# Patient Record
Sex: Male | Born: 1955 | Race: White | Hispanic: No | Marital: Married | Smoking: Never smoker
Health system: Southern US, Community
[De-identification: ages and names within clinical notes are randomized; demographics above are authoritative.]

## PROBLEM LIST (undated history)

## (undated) DIAGNOSIS — M1990 Unspecified osteoarthritis, unspecified site: Secondary | ICD-10-CM

## (undated) DIAGNOSIS — Z9889 Other specified postprocedural states: Secondary | ICD-10-CM

## (undated) DIAGNOSIS — R0600 Dyspnea, unspecified: Secondary | ICD-10-CM

## (undated) DIAGNOSIS — R112 Nausea with vomiting, unspecified: Secondary | ICD-10-CM

## (undated) DIAGNOSIS — R51 Headache: Secondary | ICD-10-CM

## (undated) DIAGNOSIS — T4145XA Adverse effect of unspecified anesthetic, initial encounter: Secondary | ICD-10-CM

## (undated) DIAGNOSIS — R519 Headache, unspecified: Secondary | ICD-10-CM

## (undated) HISTORY — PX: TONSILLECTOMY: SUR1361

---

## 1980-12-05 HISTORY — PX: VASECTOMY: SHX75

## 1999-12-06 DIAGNOSIS — T8859XA Other complications of anesthesia, initial encounter: Secondary | ICD-10-CM

## 1999-12-06 HISTORY — PX: ARTHROSCOPIC REPAIR ACL: SUR80

## 1999-12-06 HISTORY — DX: Other complications of anesthesia, initial encounter: T88.59XA

## 2004-10-08 ENCOUNTER — Ambulatory Visit: Payer: Self-pay | Admitting: Family Medicine

## 2004-12-01 ENCOUNTER — Ambulatory Visit: Payer: Self-pay | Admitting: Family Medicine

## 2016-10-10 NOTE — H&P (Signed)
TOTAL KNEE ADMISSION H&P  Patient is being admitted for left total knee arthroplasty.  Subjective:  Chief Complaint:left knee pain.  HPI: Larry Carr, 60 y.o. male, has a history of pain and functional disability in the left knee due to arthritis and has failed non-surgical conservative treatments for greater than 12 weeks to includeNSAID's and/or analgesics and corticosteriod injections.  Onset of symptoms was gradual, starting 1 years ago with rapidlly worsening course since that time. The patient noted no past surgery on the left knee(s).  Patient currently rates pain in the left knee(s) at 7 out of 10 with activity. Patient has night pain, worsening of pain with activity and weight bearing, pain that interferes with activities of daily living, crepitus and joint swelling.  Patient has evidence of subchondral sclerosis and joint space narrowing by imaging studies. There is no active infection.  There are no active problems to display for this patient.  No past medical history on file.  No past surgical history on file.  No prescriptions prior to admission.   Allergies not on file  Social History  Substance Use Topics  . Smoking status: Not on file  . Smokeless tobacco: Not on file  . Alcohol use Not on file    No family history on file.   ROS  Objective:  Physical Exam  Vital signs in last 24 hours:    Labs:   There is no height or weight on file to calculate BMI.   Imaging Review Plain radiographs demonstrate severe degenerative joint disease of the left knee(s). The overall alignment ismild varus. The bone quality appears to be fair for age and reported activity level.  Assessment/Plan:  End stage arthritis, left knee   The patient history, physical examination, clinical judgment of the provider and imaging studies are consistent with end stage degenerative joint disease of the left knee(s) and total knee arthroplasty is deemed medically necessary. The treatment  options including medical management, injection therapy arthroscopy and arthroplasty were discussed at length. The risks and benefits of total knee arthroplasty were presented and reviewed. The risks due to aseptic loosening, infection, stiffness, patella tracking problems, thromboembolic complications and other imponderables were discussed. The patient acknowledged the explanation, agreed to proceed with the plan and consent was signed. Patient is being admitted for inpatient treatment for surgery, pain control, PT, OT, prophylactic antibiotics, VTE prophylaxis, progressive ambulation and ADL's and discharge planning. The patient is planning to be discharged home with home health services

## 2016-10-11 NOTE — Pre-Procedure Instructions (Addendum)
Larry Carr  10/11/2016      [facility] [name] - [address] [place] [address] Phone: [phone] Fax: [phone]    Your procedure is scheduled on Wednesday  November 22.  [facility] 10:30 A.M.  Call this number if you have problems the morning of surgery:  [phone]   Remember:  Do not eat food or drink liquids after midnight.  Take these medicines the morning of surgery with A SIP OF WATER: acetaminophen (tylenol) if needed, flonase if needed  7 days prior to surgery(10/19/16) STOP taking any Aspirin, Aleve, Naproxen, Ibuprofen, Motrin, Advil, Goody's, BC's, all herbal medications, fish oil, and all vitamins    Do not wear jewelry, make-up or nail polish.  Do not wear lotions, powders, or perfumes, or deoderant.  Do not shave 48 hours prior to surgery.  Men may shave face and neck.  Do not bring valuables to the hospital.  [facility] is not responsible for any belongings or valuables.  Contacts, dentures or bridgework may not be worn into surgery.  Leave your suitcase in the car.  After surgery it may be brought to your room.  For patients admitted to the hospital, discharge time will be determined by your treatment team.  Patients discharged the day of surgery will not be allowed to drive home.    Special instructions:     [place]- Preparing For Surgery  Before surgery, you can play an important role. Because skin is not sterile, your skin needs to be as free of germs as possible. You can reduce the number of germs on your skin by washing with CHG (chlorahexidine gluconate) Soap before surgery.  CHG is an antiseptic cleaner which kills germs and bonds with the skin to continue killing germs even after washing.  Please do not use if you have an allergy to CHG or antibacterial soaps. If your skin becomes reddened/irritated stop using the CHG.  Do not shave  (including legs and underarms) for at least 48 hours prior to first CHG shower. It is OK to shave your face.  Please follow these instructions carefully.   1. Shower the NIGHT BEFORE SURGERY and the MORNING OF SURGERY with CHG.   2. If you chose to wash your hair, wash your hair first as usual with your normal shampoo.  3. After you shampoo, rinse your hair and body thoroughly to remove the shampoo.  4. Use CHG as you would any other liquid soap. You can apply CHG directly to the skin and wash gently with a scrungie or a clean washcloth.   5. Apply the CHG Soap to your body ONLY FROM THE NECK DOWN.  Do not use on open wounds or open sores. Avoid contact with your eyes, ears, mouth and genitals (private parts). Wash genitals (private parts) with your normal soap.  6. Wash thoroughly, paying special attention to the area where your surgery will be performed.  7. Thoroughly rinse your body with warm water from the neck down.  8. DO NOT shower/wash with your normal soap after using and rinsing off the CHG Soap.  9. Pat yourself dry with a CLEAN TOWEL.   10. Wear CLEAN PAJAMAS   11. Place CLEAN SHEETS on your bed the night of your first shower and DO NOT SLEEP WITH PETS.    Day of Surgery: Do not apply any deodorants/lotions. Please wear clean clothes  to the hospital/surgery center.      Please read over the  fact sheets that you were given.

## 2016-10-12 ENCOUNTER — Encounter (HOSPITAL_COMMUNITY)
Admission: RE | Admit: 2016-10-12 | Discharge: 2016-10-12 | Disposition: A | Discharge status: Home / Self Care | Payer: BLUE CROSS/BLUE SHIELD | Source: Ambulatory Visit | Attending: Orthopedic Surgery | Admitting: Orthopedic Surgery

## 2016-10-12 ENCOUNTER — Encounter (HOSPITAL_COMMUNITY): Payer: Self-pay

## 2016-10-12 DIAGNOSIS — M1712 Unilateral primary osteoarthritis, left knee: Secondary | ICD-10-CM | POA: Insufficient documentation

## 2016-10-12 DIAGNOSIS — Z01812 Encounter for preprocedural laboratory examination: Secondary | ICD-10-CM | POA: Diagnosis not present

## 2016-10-12 HISTORY — DX: Unspecified osteoarthritis, unspecified site: M19.90

## 2016-10-12 HISTORY — DX: Adverse effect of unspecified anesthetic, initial encounter: T41.45XA

## 2016-10-12 HISTORY — DX: Dyspnea, unspecified: R06.00

## 2016-10-12 LAB — CBC
HCT: 47.5 % (ref 39.0–52.0)
Hemoglobin: 16.4 g/dL (ref 13.0–17.0)
MCH: 29.8 pg (ref 26.0–34.0)
MCHC: 34.5 g/dL (ref 30.0–36.0)
MCV: 86.4 fL (ref 78.0–100.0)
PLATELETS: 276 10*3/uL (ref 150–400)
RBC: 5.5 MIL/uL (ref 4.22–5.81)
RDW: 12.4 % (ref 11.5–15.5)
WBC: 7.3 10*3/uL (ref 4.0–10.5)

## 2016-10-12 LAB — SURGICAL PCR SCREEN
MRSA, PCR: NEGATIVE
Staphylococcus aureus: NEGATIVE

## 2016-10-12 LAB — TYPE AND SCREEN
ABO/RH(D): O NEG
ANTIBODY SCREEN: NEGATIVE

## 2016-10-12 LAB — ABO/RH: ABO/RH(D): O NEG

## 2016-10-25 MED ORDER — TRANEXAMIC ACID 1000 MG/10ML IV SOLN
1000.0000 mg | INTRAVENOUS | Status: AC
  Administered 2016-10-26: 1000 mg via INTRAVENOUS
  Filled 2016-10-25: qty 10

## 2016-10-26 ENCOUNTER — Encounter (HOSPITAL_COMMUNITY): Payer: Self-pay | Admitting: *Deleted

## 2016-10-26 ENCOUNTER — Inpatient Hospital Stay (HOSPITAL_COMMUNITY): Payer: BLUE CROSS/BLUE SHIELD | Admitting: Anesthesiology

## 2016-10-26 ENCOUNTER — Inpatient Hospital Stay (HOSPITAL_COMMUNITY): Payer: BLUE CROSS/BLUE SHIELD

## 2016-10-26 ENCOUNTER — Inpatient Hospital Stay (HOSPITAL_COMMUNITY)
Admission: RE | Admit: 2016-10-26 | Discharge: 2016-10-28 | DRG: 470 | Disposition: A | Discharge status: Home Health | Payer: BLUE CROSS/BLUE SHIELD | Source: Ambulatory Visit | Attending: Orthopedic Surgery | Admitting: Orthopedic Surgery

## 2016-10-26 ENCOUNTER — Encounter (HOSPITAL_COMMUNITY)
Admission: RE | Disposition: A | Discharge status: Home Health | Payer: Self-pay | Source: Ambulatory Visit | Attending: Orthopedic Surgery

## 2016-10-26 DIAGNOSIS — Z96652 Presence of left artificial knee joint: Secondary | ICD-10-CM

## 2016-10-26 DIAGNOSIS — D62 Acute posthemorrhagic anemia: Secondary | ICD-10-CM | POA: Diagnosis not present

## 2016-10-26 DIAGNOSIS — M25562 Pain in left knee: Secondary | ICD-10-CM | POA: Diagnosis present

## 2016-10-26 DIAGNOSIS — M1712 Unilateral primary osteoarthritis, left knee: Secondary | ICD-10-CM | POA: Diagnosis present

## 2016-10-26 HISTORY — PX: TOTAL KNEE ARTHROPLASTY: SHX125

## 2016-10-26 SURGERY — ARTHROPLASTY, KNEE, TOTAL
Anesthesia: Regional | Site: Knee | Laterality: Left

## 2016-10-26 MED ORDER — CHLORHEXIDINE GLUCONATE 4 % EX LIQD: 60.0000 mL | Freq: Once | CUTANEOUS | Status: DC

## 2016-10-26 MED ORDER — HYDROMORPHONE HCL 2 MG/ML IJ SOLN
0.5000 mg | INTRAMUSCULAR | Status: DC | PRN
Start: 2016-10-26 — End: 2016-10-28
  Administered 2016-10-26: 0.5 mg via INTRAVENOUS
  Administered 2016-10-26: 1 mg via INTRAVENOUS
  Filled 2016-10-26 (×3): qty 1

## 2016-10-26 MED ORDER — LIDOCAINE 2% (20 MG/ML) 5 ML SYRINGE
INTRAMUSCULAR | Status: AC
  Filled 2016-10-26: qty 5

## 2016-10-26 MED ORDER — BISACODYL 10 MG RE SUPP: 10.0000 mg | Freq: Every day | RECTAL | Status: DC | PRN

## 2016-10-26 MED ORDER — MIDAZOLAM HCL 2 MG/2ML IJ SOLN
INTRAMUSCULAR | Status: AC
  Filled 2016-10-26: qty 2

## 2016-10-26 MED ORDER — FENTANYL CITRATE (PF) 100 MCG/2ML IJ SOLN
INTRAMUSCULAR | Status: AC
  Filled 2016-10-26: qty 4

## 2016-10-26 MED ORDER — ALUM & MAG HYDROXIDE-SIMETH 200-200-20 MG/5ML PO SUSP: 30.0000 mL | ORAL | Status: DC | PRN

## 2016-10-26 MED ORDER — ONDANSETRON HCL 4 MG/2ML IJ SOLN
INTRAMUSCULAR | Status: AC
  Filled 2016-10-26: qty 2

## 2016-10-26 MED ORDER — BUPIVACAINE HCL 0.5 % IJ SOLN
INTRAMUSCULAR | Status: DC | PRN
  Administered 2016-10-26: 10 mL

## 2016-10-26 MED ORDER — PHENOL 1.4 % MT LIQD: 1.0000 | OROMUCOSAL | Status: DC | PRN

## 2016-10-26 MED ORDER — METOCLOPRAMIDE HCL 5 MG PO TABS
5.0000 mg | ORAL_TABLET | Freq: Three times a day (TID) | ORAL | Status: DC | PRN
Start: 2016-10-26 — End: 2016-10-28

## 2016-10-26 MED ORDER — BUPIVACAINE LIPOSOME 1.3 % IJ SUSP
INTRAMUSCULAR | Status: DC | PRN
  Administered 2016-10-26: 20 mL

## 2016-10-26 MED ORDER — BUPIVACAINE LIPOSOME 1.3 % IJ SUSP
20.0000 mL | Freq: Once | INTRAMUSCULAR | Status: DC
  Filled 2016-10-26: qty 20

## 2016-10-26 MED ORDER — METHOCARBAMOL 1000 MG/10ML IJ SOLN
500.0000 mg | Freq: Four times a day (QID) | INTRAVENOUS | Status: DC | PRN
  Filled 2016-10-26: qty 5

## 2016-10-26 MED ORDER — ZOLPIDEM TARTRATE 5 MG PO TABS: 5.0000 mg | ORAL_TABLET | Freq: Every evening | ORAL | Status: DC | PRN

## 2016-10-26 MED ORDER — SODIUM CHLORIDE 0.9 % IR SOLN
Status: DC | PRN
  Administered 2016-10-26: 1

## 2016-10-26 MED ORDER — POTASSIUM CHLORIDE IN NACL 20-0.9 MEQ/L-% IV SOLN
INTRAVENOUS | Status: DC
  Administered 2016-10-26: 20:00:00 via INTRAVENOUS
  Filled 2016-10-26: qty 1000

## 2016-10-26 MED ORDER — CEFAZOLIN SODIUM-DEXTROSE 2-4 GM/100ML-% IV SOLN
2.0000 g | Freq: Four times a day (QID) | INTRAVENOUS | Status: AC
  Administered 2016-10-26 – 2016-10-27 (×2): 2 g via INTRAVENOUS
  Filled 2016-10-26 (×2): qty 100

## 2016-10-26 MED ORDER — CELECOXIB 200 MG PO CAPS
200.0000 mg | ORAL_CAPSULE | Freq: Two times a day (BID) | ORAL | Status: DC
  Administered 2016-10-26 – 2016-10-28 (×4): 200 mg via ORAL
  Filled 2016-10-26 (×4): qty 1

## 2016-10-26 MED ORDER — DEXAMETHASONE SODIUM PHOSPHATE 10 MG/ML IJ SOLN
10.0000 mg | Freq: Once | INTRAMUSCULAR | Status: AC
  Administered 2016-10-27: 10 mg via INTRAVENOUS
  Filled 2016-10-26: qty 1

## 2016-10-26 MED ORDER — OXYCODONE HCL 5 MG PO TABS: 5.0000 mg | ORAL_TABLET | Freq: Once | ORAL | Status: DC | PRN

## 2016-10-26 MED ORDER — ASPIRIN EC 325 MG PO TBEC: 325.0000 mg | DELAYED_RELEASE_TABLET | Freq: Every day | ORAL | 0 refills | Status: DC

## 2016-10-26 MED ORDER — LACTATED RINGERS IV SOLN
INTRAVENOUS | Status: DC
  Administered 2016-10-26 (×3): via INTRAVENOUS

## 2016-10-26 MED ORDER — ACETAMINOPHEN 325 MG PO TABS: 650.0000 mg | ORAL_TABLET | Freq: Four times a day (QID) | ORAL | Status: DC | PRN

## 2016-10-26 MED ORDER — MIDAZOLAM HCL 5 MG/5ML IJ SOLN
INTRAMUSCULAR | Status: DC | PRN
  Administered 2016-10-26 (×2): 2 mg via INTRAVENOUS

## 2016-10-26 MED ORDER — ONDANSETRON HCL 4 MG/2ML IJ SOLN
INTRAMUSCULAR | Status: DC | PRN
  Administered 2016-10-26: 4 mg via INTRAVENOUS

## 2016-10-26 MED ORDER — PROPOFOL 500 MG/50ML IV EMUL
INTRAVENOUS | Status: DC | PRN
  Administered 2016-10-26: 25 ug/kg/min via INTRAVENOUS

## 2016-10-26 MED ORDER — OXYCODONE HCL 5 MG/5ML PO SOLN: 5.0000 mg | Freq: Once | ORAL | Status: DC | PRN

## 2016-10-26 MED ORDER — METHOCARBAMOL 500 MG PO TABS: 500.0000 mg | ORAL_TABLET | Freq: Four times a day (QID) | ORAL | 0 refills | Status: DC | PRN

## 2016-10-26 MED ORDER — HYDROMORPHONE HCL 1 MG/ML IJ SOLN: 0.2500 mg | INTRAMUSCULAR | Status: DC | PRN

## 2016-10-26 MED ORDER — POLYETHYLENE GLYCOL 3350 17 G PO PACK: 17.0000 g | PACK | Freq: Every day | ORAL | Status: DC | PRN

## 2016-10-26 MED ORDER — DIPHENHYDRAMINE HCL 12.5 MG/5ML PO ELIX: 12.5000 mg | ORAL_SOLUTION | ORAL | Status: DC | PRN

## 2016-10-26 MED ORDER — LACTATED RINGERS IV SOLN
INTRAVENOUS | Status: DC
Start: 2016-10-26 — End: 2016-10-26

## 2016-10-26 MED ORDER — CEFAZOLIN SODIUM-DEXTROSE 2-4 GM/100ML-% IV SOLN
2.0000 g | INTRAVENOUS | Status: AC
  Administered 2016-10-26: 2 g via INTRAVENOUS
  Filled 2016-10-26: qty 100

## 2016-10-26 MED ORDER — ASPIRIN EC 325 MG PO TBEC
325.0000 mg | DELAYED_RELEASE_TABLET | Freq: Every day | ORAL | Status: DC
  Administered 2016-10-27 – 2016-10-28 (×2): 325 mg via ORAL
  Filled 2016-10-26 (×2): qty 1

## 2016-10-26 MED ORDER — LIDOCAINE 2% (20 MG/ML) 5 ML SYRINGE
INTRAMUSCULAR | Status: DC | PRN
  Administered 2016-10-26: 60 mg via INTRAVENOUS

## 2016-10-26 MED ORDER — METOCLOPRAMIDE HCL 5 MG/ML IJ SOLN: 5.0000 mg | Freq: Three times a day (TID) | INTRAMUSCULAR | Status: DC | PRN

## 2016-10-26 MED ORDER — ACETAMINOPHEN 650 MG RE SUPP
650.0000 mg | Freq: Four times a day (QID) | RECTAL | Status: DC | PRN
Start: 2016-10-26 — End: 2016-10-28

## 2016-10-26 MED ORDER — ONDANSETRON HCL 4 MG/2ML IJ SOLN
4.0000 mg | Freq: Four times a day (QID) | INTRAMUSCULAR | Status: DC | PRN
  Administered 2016-10-26 – 2016-10-27 (×2): 4 mg via INTRAVENOUS
  Filled 2016-10-26: qty 2

## 2016-10-26 MED ORDER — DOCUSATE SODIUM 100 MG PO CAPS
100.0000 mg | ORAL_CAPSULE | Freq: Two times a day (BID) | ORAL | Status: DC
  Administered 2016-10-26 – 2016-10-28 (×4): 100 mg via ORAL
  Filled 2016-10-26 (×4): qty 1

## 2016-10-26 MED ORDER — MENTHOL 3 MG MT LOZG: 1.0000 | LOZENGE | OROMUCOSAL | Status: DC | PRN

## 2016-10-26 MED ORDER — OXYCODONE HCL 5 MG PO TABS
5.0000 mg | ORAL_TABLET | ORAL | Status: DC | PRN
  Administered 2016-10-27 – 2016-10-28 (×7): 10 mg via ORAL
  Filled 2016-10-26 (×7): qty 2

## 2016-10-26 MED ORDER — ONDANSETRON HCL 4 MG PO TABS: 4.0000 mg | ORAL_TABLET | Freq: Three times a day (TID) | ORAL | 0 refills | Status: DC | PRN

## 2016-10-26 MED ORDER — METHOCARBAMOL 500 MG PO TABS
500.0000 mg | ORAL_TABLET | Freq: Four times a day (QID) | ORAL | Status: DC | PRN
  Administered 2016-10-27 (×2): 500 mg via ORAL
  Filled 2016-10-26 (×3): qty 1

## 2016-10-26 MED ORDER — BUPIVACAINE HCL (PF) 0.5 % IJ SOLN
INTRAMUSCULAR | Status: AC
Start: 2016-10-26 — End: 2016-10-26
  Filled 2016-10-26: qty 30

## 2016-10-26 MED ORDER — MAGNESIUM CITRATE PO SOLN: 1.0000 | Freq: Once | ORAL | Status: DC | PRN

## 2016-10-26 MED ORDER — FENTANYL CITRATE (PF) 100 MCG/2ML IJ SOLN
INTRAMUSCULAR | Status: DC | PRN
  Administered 2016-10-26 (×2): 100 ug via INTRAVENOUS

## 2016-10-26 MED ORDER — ONDANSETRON HCL 4 MG PO TABS
4.0000 mg | ORAL_TABLET | Freq: Four times a day (QID) | ORAL | Status: DC | PRN
  Administered 2016-10-27 (×2): 4 mg via ORAL
  Filled 2016-10-26 (×2): qty 1

## 2016-10-26 MED ORDER — OXYCODONE-ACETAMINOPHEN 5-325 MG PO TABS: 1.0000 | ORAL_TABLET | ORAL | 0 refills | Status: DC | PRN

## 2016-10-26 MED ORDER — SODIUM CHLORIDE 0.9 % IJ SOLN
INTRAMUSCULAR | Status: DC | PRN
  Administered 2016-10-26: 40 mL via INTRAVENOUS

## 2016-10-26 SURGICAL SUPPLY — 62 items
BANDAGE ACE 4X5 VEL STRL LF (GAUZE/BANDAGES/DRESSINGS) ×2 IMPLANT
BANDAGE ACE 6X5 VEL STRL LF (GAUZE/BANDAGES/DRESSINGS) ×2 IMPLANT
BANDAGE ESMARK 6X9 LF (GAUZE/BANDAGES/DRESSINGS) ×1 IMPLANT
BENZOIN TINCTURE PRP APPL 2/3 (GAUZE/BANDAGES/DRESSINGS) ×2 IMPLANT
BLADE SAG 18X100X1.27 (BLADE) ×4 IMPLANT
BNDG ESMARK 6X9 LF (GAUZE/BANDAGES/DRESSINGS) ×2
BOWL SMART MIX CTS (DISPOSABLE) ×2 IMPLANT
CAPT KNEE TOTAL 3 ×2 IMPLANT
CEMENT BONE SIMPLEX SPEEDSET (Cement) ×4 IMPLANT
COVER SURGICAL LIGHT HANDLE (MISCELLANEOUS) ×2 IMPLANT
CUFF TOURNIQUET SINGLE 34IN LL (TOURNIQUET CUFF) ×2 IMPLANT
DRAPE HALF SHEET 40X57 (DRAPES) ×2 IMPLANT
DRAPE IMP U-DRAPE 54X76 (DRAPES) ×2 IMPLANT
DRAPE PROXIMA HALF (DRAPES) ×2 IMPLANT
DRAPE U-SHAPE 47X51 STRL (DRAPES) ×2 IMPLANT
DRSG AQUACEL AG ADV 3.5X10 (GAUZE/BANDAGES/DRESSINGS) ×2 IMPLANT
DURAPREP 26ML APPLICATOR (WOUND CARE) ×4 IMPLANT
ELECT CAUTERY BLADE 6.4 (BLADE) ×2 IMPLANT
ELECT REM PT RETURN 9FT ADLT (ELECTROSURGICAL) ×2
ELECTRODE REM PT RTRN 9FT ADLT (ELECTROSURGICAL) ×1 IMPLANT
EVACUATOR 1/8 PVC DRAIN (DRAIN) IMPLANT
FACESHIELD WRAPAROUND (MASK) ×4 IMPLANT
GLOVE BIOGEL PI IND STRL 7.0 (GLOVE) ×1 IMPLANT
GLOVE BIOGEL PI IND STRL 7.5 (GLOVE) ×2 IMPLANT
GLOVE BIOGEL PI INDICATOR 7.0 (GLOVE) ×1
GLOVE BIOGEL PI INDICATOR 7.5 (GLOVE) ×2
GLOVE ORTHO TXT STRL SZ7.5 (GLOVE) ×2 IMPLANT
GLOVE SURG ORTHO 7.0 STRL STRW (GLOVE) ×2 IMPLANT
GOWN STRL REUS W/ TWL LRG LVL3 (GOWN DISPOSABLE) ×2 IMPLANT
GOWN STRL REUS W/ TWL XL LVL3 (GOWN DISPOSABLE) ×1 IMPLANT
GOWN STRL REUS W/TWL LRG LVL3 (GOWN DISPOSABLE) ×2
GOWN STRL REUS W/TWL XL LVL3 (GOWN DISPOSABLE) ×1
HANDPIECE INTERPULSE COAX TIP (DISPOSABLE) ×1
IMMOBILIZER KNEE 22 UNIV (SOFTGOODS) IMPLANT
IMMOBILIZER KNEE 24 THIGH 36 (MISCELLANEOUS) ×1 IMPLANT
IMMOBILIZER KNEE 24 UNIV (MISCELLANEOUS) ×2
KIT BASIN OR (CUSTOM PROCEDURE TRAY) ×2 IMPLANT
KIT ROOM TURNOVER OR (KITS) ×2 IMPLANT
MANIFOLD NEPTUNE II (INSTRUMENTS) ×2 IMPLANT
NEEDLE 18GX1X1/2 (RX/OR ONLY) (NEEDLE) ×2 IMPLANT
NEEDLE HYPO 25GX1X1/2 BEV (NEEDLE) ×2 IMPLANT
NS IRRIG 1000ML POUR BTL (IV SOLUTION) ×2 IMPLANT
PACK TOTAL JOINT (CUSTOM PROCEDURE TRAY) ×2 IMPLANT
PACK UNIVERSAL I (CUSTOM PROCEDURE TRAY) ×2 IMPLANT
PAD ARMBOARD 7.5X6 YLW CONV (MISCELLANEOUS) ×4 IMPLANT
SET HNDPC FAN SPRY TIP SCT (DISPOSABLE) ×1 IMPLANT
STRIP CLOSURE SKIN 1/2X4 (GAUZE/BANDAGES/DRESSINGS) ×2 IMPLANT
SUCTION FRAZIER HANDLE 10FR (MISCELLANEOUS) ×1
SUCTION TUBE FRAZIER 10FR DISP (MISCELLANEOUS) ×1 IMPLANT
SUT MNCRL AB 4-0 PS2 18 (SUTURE) ×2 IMPLANT
SUT VIC AB 0 CT1 27 (SUTURE) ×2
SUT VIC AB 0 CT1 27XBRD ANBCTR (SUTURE) ×2 IMPLANT
SUT VIC AB 1 CTX 36 (SUTURE) ×2
SUT VIC AB 1 CTX36XBRD ANBCTR (SUTURE) ×2 IMPLANT
SUT VIC AB 2-0 CT1 27 (SUTURE) ×2
SUT VIC AB 2-0 CT1 TAPERPNT 27 (SUTURE) ×2 IMPLANT
SYR 50ML LL SCALE MARK (SYRINGE) ×2 IMPLANT
SYR CONTROL 10ML LL (SYRINGE) ×2 IMPLANT
TOWEL OR 17X24 6PK STRL BLUE (TOWEL DISPOSABLE) ×2 IMPLANT
TOWEL OR 17X26 10 PK STRL BLUE (TOWEL DISPOSABLE) ×2 IMPLANT
TRAY CATH 16FR W/PLASTIC CATH (SET/KITS/TRAYS/PACK) IMPLANT
WATER STERILE IRR 1000ML POUR (IV SOLUTION) ×2 IMPLANT

## 2016-10-26 NOTE — Transfer of Care (Signed)
Immediate Anesthesia Transfer of Care Note  Patient: Larry Carr  Procedure(s) Performed: Procedure(s): TOTAL KNEE ARTHROPLASTY (Left)  Patient Location: PACU  Anesthesia Type:Spinal  Level of Consciousness: awake, alert , oriented and patient cooperative  Airway & Oxygen Therapy: Patient Spontanous Breathing and Patient connected to nasal cannula oxygen  Post-op Assessment: Report given to RN, Post -op Vital signs reviewed and stable and Patient moving all extremities  Post vital signs: Reviewed and stable  Last Vitals:  Vitals:   10/26/16 1048  BP: (!) 140/94  Pulse: 73  Resp: 20  Temp: 36.6 C    Last Pain:  Vitals:   10/26/16 1048  TempSrc: Oral         Complications: No apparent anesthesia complications

## 2016-10-26 NOTE — Discharge Instructions (Signed)

## 2016-10-26 NOTE — Interval H&P Note (Signed)
History and Physical Interval Note:  10/26/2016 11:18 AM  Larry Carr  has presented today for surgery, with the diagnosis of DJD LT KNEE  The various methods of treatment have been discussed with the patient and family. After consideration of risks, benefits and other options for treatment, the patient has consented to  Procedure(s): TOTAL KNEE ARTHROPLASTY (Left) as a surgical intervention .  The patient's history has been reviewed, patient examined, no change in status, stable for surgery.  I have reviewed the patient's chart and labs.  Questions were answered to the patient's satisfaction.     Loreta Aveaniel F Saryn Cherry

## 2016-10-26 NOTE — Anesthesia Postprocedure Evaluation (Signed)
Anesthesia Post Note  Patient: Financial plannerCurtis Kreiter  Procedure(s) Performed: Procedure(s) (LRB): TOTAL KNEE ARTHROPLASTY (Left)  Patient location during evaluation: PACU Anesthesia Type: Spinal and MAC Level of consciousness: awake and alert Pain management: pain level controlled Vital Signs Assessment: post-procedure vital signs reviewed and stable Respiratory status: spontaneous breathing and respiratory function stable Cardiovascular status: blood pressure returned to baseline and stable Postop Assessment: spinal receding Anesthetic complications: no    Last Vitals:  Vitals:   10/26/16 1600 10/26/16 1615  BP: 130/90 130/85  Pulse: (!) 55 71  Resp: 16 13  Temp:      Last Pain:  Vitals:   10/26/16 1048  TempSrc: Oral                 Lewie LoronJohn Zacharius Funari

## 2016-10-26 NOTE — Progress Notes (Signed)
Orthopedic Tech Progress Note Patient Details:  Ulyess MortCurtis Claar [birth year] [id number]  CPM Left Knee CPM Left Knee: On Left Knee Flexion (Degrees): 90 Left Knee Extension (Degrees): 0 Additional Comments: Foot roll   Saul FordyceJennifer C Tyrah Broers 10/26/2016, 3:36 PM

## 2016-10-26 NOTE — Discharge Summary (Addendum)
Patient ID: Larry Carr MRN: [id number] DOB/AGE: 11-May-1956 60 y.o.  Admit date: 10/26/2016 Discharge date: 10/28/2016  Admission Diagnoses:  Active Problems:   Primary localized osteoarthritis of left knee   Discharge Diagnoses:  Same  Past Medical History:  Diagnosis Date  . Arthritis   . Complication of anesthesia 2001   after acl repair vomited, Has Panic Attacks  . Dyspnea    "sob when running"    Surgeries: Procedure(s): TOTAL KNEE ARTHROPLASTY on 10/26/2016   Consultants:   Discharged Condition: Improved  Hospital Course: Larry Carr is an 60 y.o. male who was admitted 10/26/2016 for operative treatment of primary localized osteoarthritis left knee. Patient has severe unremitting pain that affects sleep, daily activities, and work/hobbies. After pre-op clearance the patient was taken to the operating room on 10/26/2016 and underwent  Procedure(s): TOTAL KNEE ARTHROPLASTY.    Patient was given perioperative antibiotics:  Anti-infectives    Start     Dose/Rate Route Frequency Ordered Stop   10/26/16 1900  ceFAZolin (ANCEF) IVPB 2g/100 mL premix     2 g 200 mL/hr over 30 Minutes Intravenous Every 6 hours 10/26/16 1742 10/27/16 0159   10/26/16 1200  ceFAZolin (ANCEF) IVPB 2g/100 mL premix     2 g 200 mL/hr over 30 Minutes Intravenous To ShortStay Surgical 10/26/16 0713 10/26/16 1335       Patient was given sequential compression devices, early ambulation, and chemoprophylaxis to prevent DVT.  Patient benefited maximally from hospital stay and there were no complications.    Recent vital signs:  Patient Vitals for the past 24 hrs:  BP Temp Temp src Pulse Resp SpO2  10/28/16 0700 140/90 - - 74 - -  10/27/16 2100 (!) 142/88 - - - - -  10/27/16 1953 - 97.6 F (36.4 C) Oral 88 18 100 %  10/27/16 1630 (!) 142/90 - - - - -  10/27/16 1420 (!) 145/109 98.9 F (37.2 C) Oral 78 18 98 %  10/27/16 1115 - - - - - 96 %  10/27/16 1015 - - - - - 97 %     Recent  laboratory studies:   Recent Labs  10/27/16 0457 10/28/16 0437  WBC 14.9* 15.6*  HGB 14.9 14.2  HCT 42.6 41.9  PLT 277 277  NA 136 138  K 3.7 4.2  CL 102 105  CO2 25 25  BUN 16 16  CREATININE 1.06 0.91  GLUCOSE 150* 115*  CALCIUM 8.6* 8.8*     Discharge Medications:     Medication List    STOP taking these medications   acetaminophen 500 MG tablet Commonly known as:  TYLENOL   ibuprofen 200 MG tablet Commonly known as:  ADVIL,MOTRIN     TAKE these medications   aspirin EC 325 MG tablet Take 1 tablet (325 mg total) by mouth daily. 1 tab a day for the next 30 days to prevent blood clots   fluticasone 50 MCG/ACT nasal spray Commonly known as:  FLONASE Place 1-2 sprays into both nostrils daily.   methocarbamol 500 MG tablet Commonly known as:  ROBAXIN Take 1 tablet (500 mg total) by mouth every 6 (six) hours as needed for muscle spasms.   ondansetron 4 MG tablet Commonly known as:  ZOFRAN Take 1 tablet (4 mg total) by mouth every 8 (eight) hours as needed for nausea or vomiting.   oxyCODONE-acetaminophen 5-325 MG tablet Commonly known as:  PERCOCET Take 1-2 tablets by mouth every 4 (four) hours as needed for severe pain.  Diagnostic Studies: Dg Knee Left Port  Result Date: 10/26/2016 CLINICAL DATA:  Post LEFT total knee replacement EXAM: PORTABLE LEFT KNEE - 1-2 VIEW COMPARISON:  Portable exam 1531 hours without priors for comparison FINDINGS: Components of LEFT knee prosthesis identified. Osseous mineralization normal. No fracture, dislocation, or bone destruction. No periprosthetic lucency. Expected anterior soft tissue changes. IMPRESSION: LEFT knee prosthesis without acute abnormalities. Electronically Signed   By: Ulyses SouthwardMark  Boles M.D.   On: 10/26/2016 15:45    Disposition: Final discharge disposition not confirmed    Follow-up Information    Loreta Aveaniel F Murphy, MD. Schedule an appointment as soon as possible for a visit in 2 week(s).   Specialty:   Orthopedic Surgery Contact information: [address] [place] [address] [phone]        [facility] Follow up.   Specialty:  Home Health Services Why:  Home Health Physical Therapy Contact information: [address] [place] [address] [phone]            Signed: Otilio SaberM Lindsey Stanbery 10/28/2016, 9:58 AM

## 2016-10-26 NOTE — Anesthesia Preprocedure Evaluation (Signed)
Anesthesia Evaluation  Patient identified by MRN, date of birth, ID band Patient awake    Reviewed: Allergy & Precautions, NPO status , Patient's Chart, lab work & pertinent test results  History of Anesthesia Complications Negative for: history of anesthetic complications  Airway Mallampati: II  TM Distance: >3 FB Neck ROM: Full    Dental  (+) Teeth Intact   Pulmonary neg pulmonary ROS,    breath sounds clear to auscultation       Cardiovascular negative cardio ROS   Rhythm:Regular     Neuro/Psych PSYCHIATRIC DISORDERS Anxiety negative neurological ROS     GI/Hepatic negative GI ROS, Neg liver ROS,   Endo/Other  negative endocrine ROS  Renal/GU negative Renal ROS     Musculoskeletal  (+) Arthritis ,   Abdominal   Peds  Hematology negative hematology ROS (+)   Anesthesia Other Findings   Reproductive/Obstetrics                             Anesthesia Physical Anesthesia Plan  ASA: II  Anesthesia Plan: Spinal and Regional   Post-op Pain Management:    Induction: Intravenous  Airway Management Planned: Natural Airway, Nasal Cannula and Simple Face Mask  Additional Equipment: None  Intra-op Plan:   Post-operative Plan:   Informed Consent: I have reviewed the patients History and Physical, chart, labs and discussed the procedure including the risks, benefits and alternatives for the proposed anesthesia with the patient or authorized representative who has indicated his/her understanding and acceptance.   Dental advisory given  Plan Discussed with: CRNA and Surgeon  Anesthesia Plan Comments:         Anesthesia Quick Evaluation

## 2016-10-26 NOTE — Progress Notes (Signed)
Pt arrived to floor alert and orientated x4 with wife on CPM machine

## 2016-10-27 LAB — CBC
HCT: 42.6 % (ref 39.0–52.0)
Hemoglobin: 14.9 g/dL (ref 13.0–17.0)
MCH: 29.9 pg (ref 26.0–34.0)
MCHC: 35 g/dL (ref 30.0–36.0)
MCV: 85.5 fL (ref 78.0–100.0)
PLATELETS: 277 10*3/uL (ref 150–400)
RBC: 4.98 MIL/uL (ref 4.22–5.81)
RDW: 12.2 % (ref 11.5–15.5)
WBC: 14.9 10*3/uL — ABNORMAL HIGH (ref 4.0–10.5)

## 2016-10-27 LAB — BASIC METABOLIC PANEL
Anion gap: 9 (ref 5–15)
BUN: 16 mg/dL (ref 6–20)
CALCIUM: 8.6 mg/dL — AB (ref 8.9–10.3)
CO2: 25 mmol/L (ref 22–32)
CREATININE: 1.06 mg/dL (ref 0.61–1.24)
Chloride: 102 mmol/L (ref 101–111)
GFR calc Af Amer: >60 mL/min (ref >60)
GLUCOSE: 150 mg/dL — AB (ref 65–99)
Potassium: 3.7 mmol/L (ref 3.5–5.1)
SODIUM: 136 mmol/L (ref 135–145)

## 2016-10-27 NOTE — Progress Notes (Signed)
Subjective: 1 Day Post-Op Procedure(s) (LRB): TOTAL KNEE ARTHROPLASTY (Left) Patient reports pain as moderate.  Patient reports nausea yesterday. None today.  No lightheadedness/dizziness, chest pain/sob.  Objective: Vital signs in last 24 hours: Temp:  [97.1 F (36.2 C)-97.9 F (36.6 C)] 97.5 F (36.4 C) (11/23 0553) Pulse Rate:  [51-82] 82 (11/23 0553) Resp:  [12-20] 18 (11/23 0553) BP: (126-148)/(75-94) 148/75 (11/23 0553) SpO2:  [92 %-100 %] 96 % (11/23 0553) Weight:  [93 kg (205 lb 0.4 oz)] 93 kg (205 lb 0.4 oz) (11/22 1954)  Intake/Output from previous day: 11/22 0701 - 11/23 0700 In: 2828.3 [P.O.:360; I.V.:2358.3; IV Piggyback:110] Out: 900 [Urine:800; Blood:100] Intake/Output this shift: Total I/O In: 240 [P.O.:240] Out: 200 [Urine:200]   Recent Labs  10/27/16 0457  HGB 14.9    Recent Labs  10/27/16 0457  WBC 14.9*  RBC 4.98  HCT 42.6  PLT 277    Recent Labs  10/27/16 0457  NA 136  K 3.7  CL 102  CO2 25  BUN 16  CREATININE 1.06  GLUCOSE 150*  CALCIUM 8.6*   No results for input(s): LABPT, INR in the last 72 hours.  Neurologically intact Neurovascular intact Sensation intact distally Intact pulses distally Dorsiflexion/Plantar flexion intact Incision: dressing C/D/I No cellulitis present Compartment soft  Assessment/Plan: 1 Day Post-Op Procedure(s) (LRB): TOTAL KNEE ARTHROPLASTY (Left) Advance diet Up with therapy D/C IV fluids Discharge home with home health likely today as long as he progresses with PT WBAT LLE ABLA-mild and stable Dry dressing change prn Please remove ace and place ted hose prior to d/c  Otilio SaberM Lindsey Noemi Ishmael 10/27/2016, 10:00 AM

## 2016-10-27 NOTE — Progress Notes (Signed)
Orthopedic Tech Progress Note Patient Details:  Larry Carr March 17, 1956 [id number]  Patient ID: Larry Carr, male   DOB: March 17, 1956, 60 y.o.   MRN: [id number] Applied cpm 0-50. This is all pt could handle.  Trinna PostMartinez, Brittnei Jagiello J 10/27/2016, 6:29 AM

## 2016-10-27 NOTE — Progress Notes (Signed)
Physical Therapy Progress Note  Slow to progress due to nausea.   10/27/16 1311  PT Visit Information  Last PT Received On 10/27/16  Assistance Needed +1  History of Present Illness Patient is a 60 yo male admitted 10/26/16 now s/p Lt TKA.   PMH:  arthritis, dyspnea with exercise  Subjective Data  Patient Stated Goal To feel better  Precautions  Precautions None  Restrictions  Weight Bearing Restrictions Yes  LLE Weight Bearing WBAT  Pain Assessment  Pain Assessment 0-10  Pain Score 4 (also c/o nausea)  Pain Location Lt knee and thigh  Pain Descriptors / Indicators Aching  Pain Intervention(s) Monitored during session;Repositioned  Cognition  Arousal/Alertness Awake/alert  Behavior During Therapy WFL for tasks assessed/performed;Anxious  Overall Cognitive Status Within Functional Limits for tasks assessed  Bed Mobility  General bed mobility comments In chair  Transfers  General transfer comment Nausea - declined mobility  Exercises  Exercises Total Joint  Total Joint Exercises  Ankle Circles/Pumps AROM;Both;10 reps;Seated  Quad Sets AROM;Left;10 reps;Seated  Towel Squeeze AROM;Both;10 reps;Seated  Short Arc Quad AROM;Left;10 reps;Seated  Heel Slides AAROM;Left;10 reps;Seated  Hip ABduction/ADduction AROM;Left;10 reps;Seated  PT - End of Session  Activity Tolerance Patient limited by pain (Limited by nausea)  Patient left in chair;with call bell/phone within reach;with family/visitor present  Nurse Communication Other (comment) (continued to c/o nausea)  PT - Assessment/Plan  PT Plan Current plan remains appropriate  PT Frequency (ACUTE ONLY) 7X/week  Follow Up Recommendations Home health PT;Supervision for mobility/OOB  PT equipment Rolling walker with 5" wheels;3in1 (PT) (Has at home)  PT Goal Progression  Progress towards PT goals Progressing toward goals  PT Time Calculation  PT Start Time (ACUTE ONLY) 1253  PT Stop Time (ACUTE ONLY) 1307  PT Time  Calculation (min) (ACUTE ONLY) 14 min  PT General Charges  $$ ACUTE PT VISIT 1 Procedure  PT Treatments  $Therapeutic Exercise 8-22 mins  Durenda HurtSusan H. Renaldo Fiddleravis, PT, [facility] Acute Rehab Services Pager [phone]

## 2016-10-27 NOTE — Evaluation (Signed)
Physical Therapy Evaluation Patient Details Name: Larry Carr MRN: [id number] DOB: 22-Nov-1956 Today's Date: 10/27/2016   History of Present Illness  Patient is a 60 yo male admitted 10/26/16 now s/p Lt TKA.   PMH:  arthritis, dyspnea with exercise  Clinical Impression  Patient presents with problems listed below.  Will benefit from acute PT to maximize functional mobility prior to d/c home with wife.  Session limited today by pain and nausea.  Recommend HHPT for continued therapy at d/c.    Follow Up Recommendations Home health PT;Supervision for mobility/OOB    Equipment Recommendations  Rolling walker with 5" wheels;3in1 (PT) (Has at home)    Recommendations for Other Services       Precautions / Restrictions Precautions Precautions: None Restrictions Weight Bearing Restrictions: Yes LLE Weight Bearing: Weight bearing as tolerated      Mobility  Bed Mobility Overal bed mobility: Modified Independent             General bed mobility comments: Verbal cues for technique.  Patient able to move to EOB with no physical assist, but increased time required.  Transfers Overall transfer level: Needs assistance Equipment used: Rolling walker (2 wheeled) Transfers: Sit to/from Stand Sit to Stand: Min assist         General transfer comment: Verbal cues for hand placement and technique.  Assist to steady during transfer.  Ambulation/Gait Ambulation/Gait assistance: Min assist Ambulation Distance (Feet): 28 Feet Assistive device: Rolling walker (2 wheeled) Gait Pattern/deviations: Step-to pattern;Decreased stance time - left;Decreased step length - right;Decreased stride length;Decreased weight shift to left;Antalgic Gait velocity: decreased Gait velocity interpretation: Below normal speed for age/gender General Gait Details: Verbal cues for safe use of RW and sequencing.  Limited by pain/nausea.  Stairs            Wheelchair Mobility    Modified Rankin  (Stroke Patients Only)       Balance Overall balance assessment: Needs assistance         Standing balance support: Bilateral upper extremity supported Standing balance-Leahy Scale: Poor                               Pertinent Vitals/Pain Pain Assessment: 0-10 Pain Score: 5  (also c/o nausea) Pain Location: Lt knee and thigh Pain Descriptors / Indicators: Aching Pain Intervention(s): Monitored during session;Repositioned;Premedicated before session    Home Living Family/patient expects to be discharged to:: Private residence Living Arrangements: Spouse/significant other Available Help at Discharge: Family;Available 24 hours/day Type of Home: House Home Access: Stairs to enter Entrance Stairs-Rails: Doctor, general practiceight;Left Entrance Stairs-Number of Steps: 5 Home Layout: One level Home Equipment: Walker - 2 wheels;Bedside commode      Prior Function Level of Independence: Independent               Hand Dominance        Extremity/Trunk Assessment   Upper Extremity Assessment: Overall WFL for tasks assessed           Lower Extremity Assessment: LLE deficits/detail   LLE Deficits / Details: Decreased strength and ROM post-op  Cervical / Trunk Assessment: Normal  Communication   Communication: No difficulties  Cognition Arousal/Alertness: Awake/alert Behavior During Therapy: WFL for tasks assessed/performed;Anxious Overall Cognitive Status: Within Functional Limits for tasks assessed                      General Comments      Exercises Total  Joint Exercises Ankle Circles/Pumps: AROM;Both;10 reps;Seated   Assessment/Plan    PT Assessment Patient needs continued PT services  PT Problem List Decreased strength;Decreased range of motion;Decreased activity tolerance;Decreased balance;Decreased mobility;Decreased knowledge of use of DME;Decreased knowledge of precautions;Pain          PT Treatment Interventions DME instruction;Gait  training;Stair training;Functional mobility training;Therapeutic activities;Therapeutic exercise;Patient/family education    PT Goals (Current goals can be found in the Care Plan section)  Acute Rehab PT Goals Patient Stated Goal: To feel better PT Goal Formulation: With patient/family Time For Goal Achievement: 11/03/16 Potential to Achieve Goals: Good    Frequency 7X/week   Barriers to discharge        Co-evaluation               End of Session Equipment Utilized During Treatment: Gait belt Activity Tolerance: Patient limited by pain (Limited by nausea) Patient left: in chair;with call bell/phone within reach;with family/visitor present Nurse Communication: Mobility status (Nausea)         Time: [id number] PT Time Calculation (min) (ACUTE ONLY): 17 min   Charges:   PT Evaluation $PT Eval Moderate Complexity: 1 Procedure     PT G Codes:        Vena AustriaSusan H [place] 10/27/2016, 10:41 AM Durenda HurtSusan H. Renaldo Fiddleravis, PT, [facility] Acute Rehab Services Pager [phone]

## 2016-10-27 NOTE — Care Management Note (Signed)
Case Management Note  Patient Details  Name: Ulyess MortCurtis Kerman MRN: [id number] Date of Birth: 12/21/1955  Subjective/Objective:   Left TKA                 Action/Plan: Discharge Planning: NCM spoke to pt and wife at home to assist with care. Pt has RW, 3n1 bedside commode and CPM at home delivered by [employer]. Preoperatively [facility]. Pt agreeable to agency.    Expected Discharge Date:  10/27/2016              Expected Discharge Plan:  Home w Home Health Services  In-House Referral:  NA  Discharge planning Services  CM Consult  Post Acute Care Choice:  Home Health Choice offered to:  Patient  DME Arranged:  3-N-1, CPM, Walker rolling DME Agency:  [employer]  HH Arranged:  PT HH Agency:  Other - See comment  Status of Service:  Completed, signed off  If discussed at Long Length of Stay Meetings, dates discussed:    Additional Comments:  Elliot CousinShavis, Connie Lasater Ellen, RN 10/27/2016, 9:38 AM

## 2016-10-27 NOTE — Progress Notes (Signed)
Orthopedic Tech Progress Note Patient Details:  Larry Carr March 05, 1956 [id number]  CPM Left Knee CPM Left Knee: On Left Knee Flexion (Degrees): 50 Left Knee Extension (Degrees): 0 Additional Comments:  (Bone foam/ Cpm - 3 hours)   Saul FordyceJennifer C Leasia Swann 10/27/2016, 3:16 PM

## 2016-10-28 ENCOUNTER — Encounter (HOSPITAL_COMMUNITY): Payer: Self-pay | Admitting: Orthopedic Surgery

## 2016-10-28 LAB — CBC
HCT: 41.9 % (ref 39.0–52.0)
Hemoglobin: 14.2 g/dL (ref 13.0–17.0)
MCH: 29.2 pg (ref 26.0–34.0)
MCHC: 33.9 g/dL (ref 30.0–36.0)
MCV: 86.2 fL (ref 78.0–100.0)
PLATELETS: 277 10*3/uL (ref 150–400)
RBC: 4.86 MIL/uL (ref 4.22–5.81)
RDW: 12.6 % (ref 11.5–15.5)
WBC: 15.6 10*3/uL — AB (ref 4.0–10.5)

## 2016-10-28 LAB — BASIC METABOLIC PANEL
ANION GAP: 8 (ref 5–15)
BUN: 16 mg/dL (ref 6–20)
CO2: 25 mmol/L (ref 22–32)
Calcium: 8.8 mg/dL — ABNORMAL LOW (ref 8.9–10.3)
Chloride: 105 mmol/L (ref 101–111)
Creatinine, Ser: 0.91 mg/dL (ref 0.61–1.24)
Glucose, Bld: 115 mg/dL — ABNORMAL HIGH (ref 65–99)
POTASSIUM: 4.2 mmol/L (ref 3.5–5.1)
SODIUM: 138 mmol/L (ref 135–145)

## 2016-10-28 NOTE — Progress Notes (Signed)
Subjective: 2 Days Post-Op Procedure(s) (LRB): TOTAL KNEE ARTHROPLASTY (Left) Patient reports pain as moderate.  Doing much better this am.  No nausea/vomiting, lightheadedness/dizziness, chest pain/sob.  Tolerating diet.  Objective: Vital signs in last 24 hours: Temp:  [97.6 F (36.4 C)-98.9 F (37.2 C)] 97.6 F (36.4 C) (11/23 1953) Pulse Rate:  [74-88] 74 (11/24 0700) Resp:  [18] 18 (11/23 1953) BP: (140-145)/(88-109) 140/90 (11/24 0700) SpO2:  [96 %-100 %] 100 % (11/23 1953)  Intake/Output from previous day: 11/23 0701 - 11/24 0700 In: 480 [P.O.:480] Out: 500 [Urine:500] Intake/Output this shift: No intake/output data recorded.   Recent Labs  10/27/16 0457 10/28/16 0437  HGB 14.9 14.2    Recent Labs  10/27/16 0457 10/28/16 0437  WBC 14.9* 15.6*  RBC 4.98 4.86  HCT 42.6 41.9  PLT 277 277    Recent Labs  10/27/16 0457 10/28/16 0437  NA 136 138  K 3.7 4.2  CL 102 105  CO2 25 25  BUN 16 16  CREATININE 1.06 0.91  GLUCOSE 150* 115*  CALCIUM 8.6* 8.8*   No results for input(s): LABPT, INR in the last 72 hours.  Neurologically intact Neurovascular intact Sensation intact distally Intact pulses distally Dorsiflexion/Plantar flexion intact Incision: dressing C/D/I No cellulitis present Compartment soft  Assessment/Plan: 2 Days Post-Op Procedure(s) (LRB): TOTAL KNEE ARTHROPLASTY (Left) Advance diet Up with therapy Discharge home with home health after first session of PT WBAT LLE Please remove ace bandage and place ted hose LLE prior to d/c. Dry dressing change prn  Otilio SaberM Lindsey Stanbery 10/28/2016, 9:55 AM

## 2016-10-28 NOTE — Progress Notes (Signed)
Occupational Therapy Evaluation Patient Details Name: Ulyess MortCurtis Norgard MRN: [id number] DOB: 1956-05-07 Today's Date: 10/28/2016    History of Present Illness Patient is a 60 yo male admitted 10/26/16 now s/p Lt TKA.   PMH:  arthritis, dyspnea with exercise   Clinical Impression   Completed all education with pt/family regarding compensatory techniques for ADL, functional mobility and reducing risk of falls. Pt able to return demonstrate. Ready for DC home when medically stable.     Follow Up Recommendations  No OT follow up;Supervision - Intermittent    Equipment Recommendations  None recommended by OT    Recommendations for Other Services       Precautions / Restrictions Precautions Precautions: None Restrictions Weight Bearing Restrictions: Yes LLE Weight Bearing: Weight bearing as tolerated      Mobility Bed Mobility Overal bed mobility: Modified Independent                Transfers Overall transfer level: Modified independent Equipment used: Rolling walker (2 wheeled)             General transfer comment: carry over of safe hand placement     Balance             Standing balance-Leahy Scale: Fair                              ADL Overall ADL's : Needs assistance/impaired         Upper Body Bathing: Set up   Lower Body Bathing: Set up;Supervison/ safety   Upper Body Dressing : Set up   Lower Body Dressing: Minimal assistance;Sit to/from stand   Toilet Transfer: Supervision/safety;Ambulation   Toileting- Clothing Manipulation and Hygiene: Modified independent       Functional mobility during ADLs: Supervision/safety General ADL Comments: Completed education regarding compensatory techniques for ADL and home safety/reducing risk of falls.. Pt has tub bench and plans to use. REviewed how to safely transfer to tub bench. Pt verbalized understanding.      Vision     Perception     Praxis      Pertinent  Vitals/Pain Pain Assessment: 0-10 Pain Score: 4  Pain Location: with movement Pain Descriptors / Indicators: Aching;Sore Pain Intervention(s): Limited activity within patient's tolerance;Monitored during session;Premedicated before session;Repositioned     Hand Dominance     Extremity/Trunk Assessment     Lower Extremity Assessment LLE Deficits / Details: Decreased strength and ROM post-op LLE: Unable to fully assess due to pain   Cervical / Trunk Assessment Cervical / Trunk Assessment: Normal   Communication Communication Communication: No difficulties   Cognition Arousal/Alertness: Awake/alert Behavior During Therapy: WFL for tasks assessed/performed;Anxious Overall Cognitive Status: Within Functional Limits for tasks assessed                     General Comments       Exercises Exercises: Total Joint     Shoulder Instructions      Home Living Family/patient expects to be discharged to:: Private residence Living Arrangements: Spouse/significant other Available Help at Discharge: Family;Available 24 hours/day Type of Home: House Home Access: Stairs to enter Entergy CorporationEntrance Stairs-Number of Steps: 5 Entrance Stairs-Rails: Right;Left Home Layout: One level     Bathroom Shower/Tub: Tub/shower unit;Curtain Shower/tub characteristics: Engineer, building servicesCurtain Bathroom Toilet: Handicapped height     Home Equipment: Environmental consultantWalker - 2 wheels;Bedside commode;Tub bench          Prior Functioning/Environment Level of Independence:  Independent                 OT Problem List: Decreased strength;Decreased range of motion;Decreased knowledge of use of DME or AE;Decreased knowledge of precautions;Pain   OT Treatment/Interventions:      OT Goals(Current goals can be found in the care plan section) Acute Rehab OT Goals Patient Stated Goal: To feel better OT Goal Formulation: All assessment and education complete, DC therapy  OT Frequency:     Barriers to D/C:             Co-evaluation              End of Session Equipment Utilized During Treatment: Gait belt;Rolling walker Nurse Communication: Other (comment) (ready for DC)  Activity Tolerance: Patient tolerated treatment well Patient left: in bed;with call bell/phone within reach;with family/visitor present   Time: 4098-11911253-1308 OT Time Calculation (min): 15 min Charges:  OT General Charges $OT Visit: 1 Procedure OT Evaluation $OT Eval Low Complexity: 1 Procedure G-Codes:    Maxie Slovacek,HILLARY 10/28/2016, 2:00 PM   [facility] Ryan Palermo, OTR/L  [phone] 10/28/2016

## 2016-10-28 NOTE — Progress Notes (Signed)
Physical Therapy Treatment Patient Details Name: Ulyess MortCurtis Varnell MRN: [id number] DOB: [birth year] Today's Date: 10/28/2016    History of Present Illness Patient is a 60 yo male admitted 10/26/16 now s/p Lt TKA.   PMH:  arthritis, dyspnea with exercise    PT Comments    Patient is making good progress with PT.  From a mobility standpoint anticipate patient will be ready for DC home when medically ready.     Follow Up Recommendations  Home health PT;Supervision for mobility/OOB     Equipment Recommendations  Rolling walker with 5" wheels;3in1 (PT) (Has at home)    Recommendations for Other Services       Precautions / Restrictions Precautions Precautions: Knee Restrictions Weight Bearing Restrictions: Yes LLE Weight Bearing: Weight bearing as tolerated    Mobility  Bed Mobility Overal bed mobility: Modified Independent                Transfers Overall transfer level: Modified independent Equipment used: Rolling walker (2 wheeled)             General transfer comment: carry over of safe hand placement   Ambulation/Gait Ambulation/Gait assistance: Supervision Ambulation Distance (Feet): 225 Feet Assistive device: Rolling walker (2 wheeled) Gait Pattern/deviations: Step-through pattern     General Gait Details: pt with improved gait mechanics and ability to WB on L LE; good heel strike and steady gait   Stairs Stairs: Yes Stairs assistance: Min guard Stair Management: One rail Left;Step to pattern;Forwards Number of Stairs: 5 General stair comments: cues for sequencing and technique; min guard for safety  Wheelchair Mobility    Modified Rankin (Stroke Patients Only)       Balance                                    Cognition Arousal/Alertness: Awake/alert Behavior During Therapy: WFL for tasks assessed/performed;Anxious Overall Cognitive Status: Within Functional Limits for tasks assessed                       Exercises Total Joint Exercises Quad Sets: AROM;Left;10 reps;Supine Heel Slides: Left;10 reps;AROM;Supine Knee Flexion: AROM;Left;5 reps;Seated Goniometric ROM: 8-70    General Comments General comments (skin integrity, edema, etc.): reviewed HEP handout and pt able to demonstrate each exercise without assistance      Pertinent Vitals/Pain Pain Assessment: 0-10 Pain Score: 4  Pain Location: L knee and thigh Pain Descriptors / Indicators: Aching;Sore Pain Intervention(s): Limited activity within patient's tolerance;Monitored during session;Premedicated before session;Repositioned    Home Living                      Prior Function            PT Goals (current goals can now be found in the care plan section) Acute Rehab PT Goals Patient Stated Goal: go home Progress towards PT goals: Progressing toward goals    Frequency    7X/week      PT Plan Current plan remains appropriate    Co-evaluation             End of Session Equipment Utilized During Treatment: Gait belt Activity Tolerance: Patient tolerated treatment well Patient left: with call bell/phone within reach;with family/visitor present;in bed     Time: 4098-11911122-1154 PT Time Calculation (min) (ACUTE ONLY): 32 min  Charges:  $Gait Training: 8-22 mins $Therapeutic Exercise: 8-22 mins  G Codes:      Derek MoundKellyn R Capri Veals Calan Doren, PTA Pager: [phone]   10/28/2016, 12:04 PM

## 2016-10-28 NOTE — Progress Notes (Signed)
Orthopedic Tech Progress Note Patient Details:  Larry Carr 1956/09/23 [id number]  Patient ID: Larry Carr, male   DOB: 1956/09/23, 60 y.o.   MRN: [id number] Pt already in cpm  Larry Carr, Larry Carr 10/28/2016, 6:14 AM

## 2016-10-31 NOTE — Op Note (Signed)
NAME:  Larry Carr, Larry Carr              ACCOUNT NO.:  [id number]  MEDICAL RECORD NO.:  [id number]  LOCATION:                                 FACILITY:  PHYSICIAN:  Loreta Aveaniel F. Joyce Heitman, M.D. DATE OF BIRTH:  08-07-1956  DATE OF PROCEDURE:  10/26/2016 DATE OF DISCHARGE:                              OPERATIVE REPORT   PREOPERATIVE DIAGNOSIS:  Left knee end-stage arthritis, primary generalized.  POSTOPERATIVE DIAGNOSIS:  Left knee end-stage arthritis, primary generalized.  PROCEDURE:  Left knee modified minimally invasive total knee replacement Stryker triathlon prosthesis.  Cemented pegged posterior stabilized #5 femoral component.  Cemented #6 tibial component, 9 mm PS insert. Cemented resurfacing 35-mm patellar component.  SURGEON:  Loreta Aveaniel F. Ascencion Stegner, MD.  ASSISTANT:  Mikey KirschnerLindsey Stanberry, PA, who present throughout the entire case and necessary for timely completion of procedure.  ANESTHESIA:  Spinal.  BLOOD LOSS:  Minimal.  SPECIMENS:  None.  CULTURES:  None.  COMPLICATIONS:  None.  DRESSINGS:  Soft compressive knee immobilizer.  TOURNIQUET TIME:  1 hour.  DESCRIPTION OF PROCEDURE:  The patient was brought to the operating room and after adequate anesthesia had been obtained, tourniquet applied, prepped and draped in usual sterile fashion.  Exsanguinated with elevation of Esmarch, tourniquet inflated to 350 mmHg.  Straight incision above the patella down the tibial tubercle.  Medial arthrotomy, vastus splitting.  Intramedullary guide.  Distal femur 8 mm resection, 5 degrees of valgus.  Using epicondylar axis, the femur was sized, cut, and fitted for a posterior stabilized pegged #5 component.  Proximal tibial resection with extramedullary guide.  Sized for #6 component, rotation set with trials, hand reamed.  Resurfacing patella with 35 mm component after 10 mm resection.  Wound irrigated.  Cement prepared, placed on all components, firmly seated.  Polyethylene attached  to tibia, knee reduced.  Patella with a clamp.  At completion, I was very pleased of biomechanical axis.  Nicely balanced knee through full motion, great stability, good patellar tracking.  Soft tissue was injected with Exparel after the knee was irrigated.  Arthrotomy closed with Vicryl with subcutaneous and subcuticular closure.  Margins were injected with Marcaine.  Sterile compressive dressing applied.  Tourniquet deflated and removed.  Knee immobilizer applied.  Anesthesia reversed.  Brought to the recovery room.  Tolerated the surgery well.  No complications.     Loreta Aveaniel F. Vlasta Baskin, M.D.   ______________________________ Loreta Aveaniel F. Jeannifer Drakeford, M.D.    DFM/MEDQ  D:  10/26/2016  T:  10/27/2016  Job:  [id number]

## 2016-11-08 NOTE — H&P (Signed)
TOTAL KNEE ADMISSION H&P  Patient is being admitted for right total knee arthroplasty.  Subjective:  Chief Complaint:right knee pain.  HPI: Larry Carr, 60 y.o. male, has a history of pain and functional disability in the right knee due to arthritis and has failed non-surgical conservative treatments for greater than 12 weeks to includeNSAID's and/or analgesics, corticosteriod injections and activity modification.  Onset of symptoms was gradual, starting 6 years ago with gradually worsening course since that time. The patient noted no past surgery on the right knee(s).  Patient currently rates pain in the right knee(s) at 4 out of 10 with activity. Patient has night pain, worsening of pain with activity and weight bearing, crepitus and joint swelling.  Patient has evidence of subchondral sclerosis and joint space narrowing by imaging studies. This patient has had . There is no active infection.  Patient Active Problem List   Diagnosis Date Noted  . Primary localized osteoarthritis of left knee 10/26/2016   Past Medical History:  Diagnosis Date  . Arthritis   . Complication of anesthesia 2001   after acl repair vomited, Has Panic Attacks  . Dyspnea    "sob when running"    Past Surgical History:  Procedure Laterality Date  . ARTHROSCOPIC REPAIR ACL Right 2001  . TONSILLECTOMY  60's  . TOTAL KNEE ARTHROPLASTY Left 10/26/2016  . TOTAL KNEE ARTHROPLASTY Left 10/26/2016   Procedure: TOTAL KNEE ARTHROPLASTY;  Surgeon: Loreta Aveaniel F Murphy, MD;  [facility];  Service: Orthopedics;  Laterality: Left;  Marland Kitchen. VASECTOMY  1982    No prescriptions prior to admission.   Allergies  Allergen Reactions  . Other     Cantulope- itching Ragweed-severe allergies Raw Potato-itching    Social History  Substance Use Topics  . Smoking status: Never Smoker  . Smokeless tobacco: Never Used  . Alcohol use Not on file     Comment: occ margarita 1-2 tx week, occ beer    No family history on file.    Review of Systems  Constitutional: Negative.   HENT: Negative.   Eyes: Negative.   Respiratory: Negative.   Cardiovascular: Negative.   Gastrointestinal: Negative.   Genitourinary: Negative.   Musculoskeletal: Positive for joint pain.  Skin: Negative.   Neurological: Negative.   Endo/Heme/Allergies: Negative.   Psychiatric/Behavioral: Negative.     Objective:  Physical Exam  Constitutional: He is oriented to person, place, and time. He appears well-developed and well-nourished.  HENT:  Head: Normocephalic and atraumatic.  Eyes: EOM are normal. Pupils are equal, round, and reactive to light.  Neck: Normal range of motion. Neck supple.  Cardiovascular: Normal rate and regular rhythm.   Respiratory: Effort normal and breath sounds normal.  GI: Soft. Bowel sounds are normal.  Musculoskeletal:  On his exam antalgic gait, varus thrust, both sides.  Right knee motion 5-100.  The left is close to extension, 110 degrees of flexion.  Tibiofemoral and patellofemoral crepitus.  Reasonable strength.    Neurological: He is alert and oriented to person, place, and time.  Skin: Skin is warm.  Psychiatric: He has a normal mood and affect. His behavior is normal. Judgment and thought content normal.    Vital signs in last 24 hours: BP: ()/()  Arterial Line BP: ()/()   Labs:   Estimated body mass index is 31.17 kg/m as calculated from the following:   Height as of 10/26/16: 5\' 8"  (1.727 m).   Weight as of 10/26/16: 93 kg (205 lb 0.4 oz).   Imaging Review  Plain radiographs demonstrate severe degenerative joint disease of the right knee(s). The overall alignment ismild varus. The bone quality appears to be fair for age and reported activity level.  Assessment/Plan:  End stage arthritis, right knee   The patient history, physical examination, clinical judgment of the provider and imaging studies are consistent with end stage degenerative joint disease of the right knee(s) and total  knee arthroplasty is deemed medically necessary. The treatment options including medical management, injection therapy arthroscopy and arthroplasty were discussed at length. The risks and benefits of total knee arthroplasty were presented and reviewed. The risks due to aseptic loosening, infection, stiffness, patella tracking problems, thromboembolic complications and other imponderables were discussed. The patient acknowledged the explanation, agreed to proceed with the plan and consent was signed. Patient is being admitted for inpatient treatment for surgery, pain control, PT, OT, prophylactic antibiotics, VTE prophylaxis, progressive ambulation and ADL's and discharge planning. The patient is planning to be discharged home with home health services

## 2016-11-10 ENCOUNTER — Other Ambulatory Visit (HOSPITAL_COMMUNITY): Payer: Self-pay | Admitting: Pharmacy Technician

## 2016-11-10 ENCOUNTER — Other Ambulatory Visit (HOSPITAL_COMMUNITY): Payer: Self-pay | Admitting: Urology

## 2016-11-11 ENCOUNTER — Other Ambulatory Visit (HOSPITAL_COMMUNITY): Payer: Self-pay | Admitting: Urology

## 2016-11-11 ENCOUNTER — Encounter (HOSPITAL_COMMUNITY): Payer: Self-pay

## 2016-11-11 ENCOUNTER — Other Ambulatory Visit: Payer: Self-pay

## 2016-11-11 ENCOUNTER — Encounter (HOSPITAL_COMMUNITY)
Admission: RE | Admit: 2016-11-11 | Discharge: 2016-11-11 | Disposition: A | Discharge status: Home / Self Care | Payer: BLUE CROSS/BLUE SHIELD | Source: Ambulatory Visit | Attending: Orthopedic Surgery | Admitting: Orthopedic Surgery

## 2016-11-11 DIAGNOSIS — M1711 Unilateral primary osteoarthritis, right knee: Secondary | ICD-10-CM | POA: Insufficient documentation

## 2016-11-11 DIAGNOSIS — Z01810 Encounter for preprocedural cardiovascular examination: Secondary | ICD-10-CM | POA: Diagnosis not present

## 2016-11-11 DIAGNOSIS — Z01812 Encounter for preprocedural laboratory examination: Secondary | ICD-10-CM | POA: Diagnosis present

## 2016-11-11 HISTORY — DX: Nausea with vomiting, unspecified: R11.2

## 2016-11-11 HISTORY — DX: Other specified postprocedural states: Z98.890

## 2016-11-11 HISTORY — DX: Headache: R51

## 2016-11-11 HISTORY — DX: Headache, unspecified: R51.9

## 2016-11-11 LAB — CBC
HCT: 43.4 % (ref 39.0–52.0)
HEMOGLOBIN: 15.1 g/dL (ref 13.0–17.0)
MCH: 30.1 pg (ref 26.0–34.0)
MCHC: 34.8 g/dL (ref 30.0–36.0)
MCV: 86.6 fL (ref 78.0–100.0)
PLATELETS: 380 10*3/uL (ref 150–400)
RBC: 5.01 MIL/uL (ref 4.22–5.81)
RDW: 12.3 % (ref 11.5–15.5)
WBC: 11.2 10*3/uL — ABNORMAL HIGH (ref 4.0–10.5)

## 2016-11-11 LAB — SURGICAL PCR SCREEN
MRSA, PCR: NEGATIVE
STAPHYLOCOCCUS AUREUS: NEGATIVE

## 2016-11-11 LAB — TYPE AND SCREEN
ABO/RH(D): O NEG
ANTIBODY SCREEN: NEGATIVE

## 2016-11-11 NOTE — Progress Notes (Signed)
PCP: Dr. Sol Passerough Pt denies cardiologist and cardiac hx  EKG: 11/11/16  No complaints of chest pain, SOB or signs of infection at PAT appointment per pt.

## 2016-11-11 NOTE — Progress Notes (Signed)
Larry Carr            11/11/2016                          [facility] [name] [address] [place] [id number] Phone: [phone] Fax: [phone]              Your procedure is scheduled on Wednesday December 20.            [facility].M.            Call this number if you have problems the morning of surgery:            [phone]             Remember:            Do not eat food or drink liquids after midnight.            Take these medicines the morning of surgery with A SIP OF WATER: lorazepam (ativan) if needed, methocarbamol (Robaxin) if needed, ondansetron (zofran) if needed, oxycodone (percocet) if needed, flonase if needed  7 days prior to surgery STOP taking any Aspirin, Aleve, Naproxen, Ibuprofen, Motrin, Advil, Goody's, BC's, all herbal medications, fish oil, and all vitamins              Do not wear jewelry, make-up or nail polish.            Do not wear lotions, powders, or perfumes, or deoderant.            Do not shave 48 hours prior to surgery.  Men may shave face and neck.            Do not bring valuables to the hospital.            [facility] is not responsible for any belongings or valuables.  Contacts, dentures or bridgework may not be worn into surgery.  Leave your suitcase in the car.  After surgery it may be brought to your room.  For patients admitted to the hospital, discharge time will be determined by your treatment team.  Patients discharged the day of surgery will not be allowed to drive home.    Special instructions:    - Preparing For Surgery  Before surgery, you can play an important role. Because skin is not sterile, your skin needs to be as free of germs as possible. You can reduce the number of germs on your skin by washing with CHG (chlorahexidine gluconate) Soap before surgery.  CHG is an antiseptic  cleaner which kills germs and bonds with the skin to continue killing germs even after washing.  Please do not use if you have an allergy to CHG or antibacterial soaps. If your skin becomes reddened/irritated stop using the CHG.  Do not shave (including legs and underarms) for at least 48 hours prior to first CHG shower. It is OK to shave your face.  Please follow these instructions carefully.  1. Shower the NIGHT BEFORE SURGERY and the MORNING OF SURGERY with CHG.   2. If you chose to wash your hair, wash your hair first as usual with your normal shampoo.  3. After you shampoo, rinse your hair and body thoroughly to remove the shampoo.  4. Use CHG as you would any other liquid soap. You can apply CHG directly to the skin and wash gently with a scrungie or a clean washcloth.   5. Apply the CHG Soap to your body ONLY FROM THE NECK DOWN.  Do not use on open wounds or open sores. Avoid contact with your eyes, ears, mouth and genitals (private parts). Wash genitals (private parts) with your normal soap.  6. Wash thoroughly, paying special attention to the area where your surgery will be performed.  7. Thoroughly rinse your body with warm water from the neck down.  8. DO NOT shower/wash with your normal soap after using and rinsing off the CHG Soap.  9. Pat yourself dry with a CLEAN TOWEL.   10. Wear CLEAN PAJAMAS   11. Place CLEAN SHEETS on your bed the night of your first shower and DO NOT SLEEP WITH PETS.    Day of Surgery: Do not apply any deodorants/lotions. Please wear clean clothes to the hospital/surgery center.      Please read over the following fact sheets that you were given. MRSA Information

## 2016-11-14 NOTE — H&P (Signed)
Physical Exam:  Height: 5'8". Weight: 206 pounds. BP: 156/95. Pulse: 65. 02 sat: 97% on room air. Oral temp: 97.9 F. Alert and oriented x 3, in no acute distress. He does have an antalgic gait with a Trendelenburg component on the left.  Head is normocephalic atraumatic. PERRLA. EOMI. Neck unremarkable. Lungs clear to auscultation bilaterally. No wheezes, rales, or rhonchi. Heart regular rate and rhythm; no murmurs. Abdomen is soft, nontender, normoactive bowel sounds times 4. Calves nontender bilaterally. Neuro is grossly intact bilateral upper and lower extremities. Skin is warm and dry. Examination of his left knee reveals varus thrust. Range of motion 0-110 degrees. Marked patellofemoral crepitus. Neurovascularly intact distally.

## 2016-11-22 MED ORDER — TRANEXAMIC ACID 1000 MG/10ML IV SOLN
1000.0000 mg | INTRAVENOUS | Status: AC
  Administered 2016-11-23: 1000 mg via INTRAVENOUS
  Filled 2016-11-22: qty 10

## 2016-11-23 ENCOUNTER — Inpatient Hospital Stay (HOSPITAL_COMMUNITY): Payer: BLUE CROSS/BLUE SHIELD | Admitting: Certified Registered Nurse Anesthetist

## 2016-11-23 ENCOUNTER — Inpatient Hospital Stay (HOSPITAL_COMMUNITY): Payer: BLUE CROSS/BLUE SHIELD

## 2016-11-23 ENCOUNTER — Encounter (HOSPITAL_COMMUNITY): Payer: Self-pay | Admitting: Certified Registered Nurse Anesthetist

## 2016-11-23 ENCOUNTER — Encounter (HOSPITAL_COMMUNITY)
Admission: RE | Disposition: A | Discharge status: Home Health | Payer: Self-pay | Source: Ambulatory Visit | Attending: Orthopedic Surgery

## 2016-11-23 ENCOUNTER — Inpatient Hospital Stay (HOSPITAL_COMMUNITY)
Admission: RE | Admit: 2016-11-23 | Discharge: 2016-11-24 | DRG: 470 | Disposition: A | Discharge status: Home Health | Payer: BLUE CROSS/BLUE SHIELD | Source: Ambulatory Visit | Attending: Orthopedic Surgery | Admitting: Orthopedic Surgery

## 2016-11-23 DIAGNOSIS — Z96659 Presence of unspecified artificial knee joint: Secondary | ICD-10-CM

## 2016-11-23 DIAGNOSIS — M1711 Unilateral primary osteoarthritis, right knee: Secondary | ICD-10-CM | POA: Diagnosis present

## 2016-11-23 DIAGNOSIS — Z96652 Presence of left artificial knee joint: Secondary | ICD-10-CM | POA: Diagnosis present

## 2016-11-23 DIAGNOSIS — M25561 Pain in right knee: Secondary | ICD-10-CM | POA: Diagnosis present

## 2016-11-23 HISTORY — PX: HARDWARE REMOVAL: SHX979

## 2016-11-23 HISTORY — PX: TOTAL KNEE ARTHROPLASTY: SHX125

## 2016-11-23 SURGERY — ARTHROPLASTY, KNEE, TOTAL
Anesthesia: Spinal | Site: Knee | Laterality: Right

## 2016-11-23 MED ORDER — MIDAZOLAM HCL 2 MG/2ML IJ SOLN
INTRAMUSCULAR | Status: AC
  Filled 2016-11-23: qty 2

## 2016-11-23 MED ORDER — DEXAMETHASONE SODIUM PHOSPHATE 10 MG/ML IJ SOLN
INTRAMUSCULAR | Status: DC | PRN
  Administered 2016-11-23: 5 mg via INTRAVENOUS

## 2016-11-23 MED ORDER — CELECOXIB 200 MG PO CAPS
200.0000 mg | ORAL_CAPSULE | Freq: Two times a day (BID) | ORAL | Status: DC
  Administered 2016-11-23 – 2016-11-24 (×2): 200 mg via ORAL
  Filled 2016-11-23 (×2): qty 1

## 2016-11-23 MED ORDER — SCOPOLAMINE 1 MG/3DAYS TD PT72
1.0000 | MEDICATED_PATCH | TRANSDERMAL | Status: DC
  Administered 2016-11-23: 10:00:00 via TRANSDERMAL

## 2016-11-23 MED ORDER — DOCUSATE SODIUM 100 MG PO CAPS
100.0000 mg | ORAL_CAPSULE | Freq: Two times a day (BID) | ORAL | Status: DC
  Administered 2016-11-23 – 2016-11-24 (×2): 100 mg via ORAL
  Filled 2016-11-23 (×2): qty 1

## 2016-11-23 MED ORDER — ASPIRIN EC 325 MG PO TBEC: 325.0000 mg | DELAYED_RELEASE_TABLET | Freq: Every day | ORAL | 0 refills | Status: AC

## 2016-11-23 MED ORDER — SODIUM CHLORIDE 0.9 % IR SOLN
Status: DC | PRN
Start: 2016-11-23 — End: 2016-11-23
  Administered 2016-11-23: 3000 mL

## 2016-11-23 MED ORDER — METHOCARBAMOL 1000 MG/10ML IJ SOLN
500.0000 mg | Freq: Four times a day (QID) | INTRAVENOUS | Status: DC | PRN
  Filled 2016-11-23: qty 5

## 2016-11-23 MED ORDER — PROMETHAZINE HCL 25 MG/ML IJ SOLN: 6.2500 mg | INTRAMUSCULAR | Status: DC | PRN

## 2016-11-23 MED ORDER — CHLORHEXIDINE GLUCONATE 4 % EX LIQD: 60.0000 mL | Freq: Once | CUTANEOUS | Status: DC

## 2016-11-23 MED ORDER — HYDROMORPHONE HCL 1 MG/ML IJ SOLN: 0.2500 mg | INTRAMUSCULAR | Status: DC | PRN

## 2016-11-23 MED ORDER — OXYCODONE HCL 5 MG PO TABS
5.0000 mg | ORAL_TABLET | ORAL | Status: DC | PRN
  Administered 2016-11-23 – 2016-11-24 (×7): 10 mg via ORAL
  Filled 2016-11-23 (×7): qty 2

## 2016-11-23 MED ORDER — MAGNESIUM CITRATE PO SOLN: 1.0000 | Freq: Once | ORAL | Status: DC | PRN

## 2016-11-23 MED ORDER — CEFAZOLIN SODIUM-DEXTROSE 2-4 GM/100ML-% IV SOLN
2.0000 g | INTRAVENOUS | Status: AC
  Administered 2016-11-23: 2 g via INTRAVENOUS

## 2016-11-23 MED ORDER — BUPIVACAINE HCL (PF) 0.5 % IJ SOLN
INTRAMUSCULAR | Status: AC
  Filled 2016-11-23: qty 10

## 2016-11-23 MED ORDER — MIDAZOLAM HCL 5 MG/5ML IJ SOLN
INTRAMUSCULAR | Status: DC | PRN
  Administered 2016-11-23: 2 mg via INTRAVENOUS

## 2016-11-23 MED ORDER — CEFAZOLIN SODIUM-DEXTROSE 2-4 GM/100ML-% IV SOLN
2.0000 g | Freq: Four times a day (QID) | INTRAVENOUS | Status: AC
  Administered 2016-11-23 (×2): 2 g via INTRAVENOUS
  Filled 2016-11-23 (×2): qty 100

## 2016-11-23 MED ORDER — BUPIVACAINE LIPOSOME 1.3 % IJ SUSP
INTRAMUSCULAR | Status: DC | PRN
  Administered 2016-11-23: 20 mL

## 2016-11-23 MED ORDER — BUPIVACAINE HCL (PF) 0.75 % IJ SOLN
INTRAMUSCULAR | Status: DC | PRN
  Administered 2016-11-23: 2 mL via INTRATHECAL

## 2016-11-23 MED ORDER — METHOCARBAMOL 500 MG PO TABS: 500.0000 mg | ORAL_TABLET | Freq: Four times a day (QID) | ORAL | 0 refills | Status: AC

## 2016-11-23 MED ORDER — DEXAMETHASONE SODIUM PHOSPHATE 10 MG/ML IJ SOLN
10.0000 mg | Freq: Once | INTRAMUSCULAR | Status: AC
  Administered 2016-11-24: 10 mg via INTRAVENOUS
  Filled 2016-11-23: qty 1

## 2016-11-23 MED ORDER — ONDANSETRON HCL 4 MG/2ML IJ SOLN: 4.0000 mg | Freq: Four times a day (QID) | INTRAMUSCULAR | Status: DC | PRN

## 2016-11-23 MED ORDER — BUPIVACAINE HCL 0.5 % IJ SOLN
INTRAMUSCULAR | Status: DC | PRN
  Administered 2016-11-23: 10 mL

## 2016-11-23 MED ORDER — METHOCARBAMOL 500 MG PO TABS
500.0000 mg | ORAL_TABLET | Freq: Four times a day (QID) | ORAL | Status: DC | PRN
  Administered 2016-11-23 – 2016-11-24 (×3): 500 mg via ORAL
  Filled 2016-11-23 (×3): qty 1

## 2016-11-23 MED ORDER — ONDANSETRON HCL 4 MG PO TABS: 4.0000 mg | ORAL_TABLET | Freq: Three times a day (TID) | ORAL | 0 refills | Status: AC | PRN

## 2016-11-23 MED ORDER — SODIUM CHLORIDE 0.9 % IJ SOLN
INTRAMUSCULAR | Status: DC | PRN
Start: 2016-11-23 — End: 2016-11-23
  Administered 2016-11-23: 40 mL

## 2016-11-23 MED ORDER — BISACODYL 5 MG PO TBEC: 5.0000 mg | DELAYED_RELEASE_TABLET | Freq: Every day | ORAL | Status: DC | PRN

## 2016-11-23 MED ORDER — FENTANYL CITRATE (PF) 100 MCG/2ML IJ SOLN
INTRAMUSCULAR | Status: DC | PRN
  Administered 2016-11-23: 50 ug via INTRAVENOUS

## 2016-11-23 MED ORDER — ACETAMINOPHEN 650 MG RE SUPP
650.0000 mg | Freq: Four times a day (QID) | RECTAL | Status: DC | PRN
Start: 2016-11-23 — End: 2016-11-24

## 2016-11-23 MED ORDER — BUPIVACAINE LIPOSOME 1.3 % IJ SUSP
20.0000 mL | Freq: Once | INTRAMUSCULAR | Status: DC
  Filled 2016-11-23: qty 20

## 2016-11-23 MED ORDER — LACTATED RINGERS IV SOLN
INTRAVENOUS | Status: DC
  Administered 2016-11-23 (×2): via INTRAVENOUS

## 2016-11-23 MED ORDER — SCOPOLAMINE 1 MG/3DAYS TD PT72
MEDICATED_PATCH | TRANSDERMAL | Status: AC
  Filled 2016-11-23: qty 1

## 2016-11-23 MED ORDER — LORAZEPAM 0.5 MG PO TABS: 0.5000 mg | ORAL_TABLET | Freq: Three times a day (TID) | ORAL | Status: DC | PRN

## 2016-11-23 MED ORDER — PROPOFOL 10 MG/ML IV BOLUS
INTRAVENOUS | Status: AC
  Filled 2016-11-23: qty 20

## 2016-11-23 MED ORDER — ALUM & MAG HYDROXIDE-SIMETH 200-200-20 MG/5ML PO SUSP: 30.0000 mL | ORAL | Status: DC | PRN

## 2016-11-23 MED ORDER — METOCLOPRAMIDE HCL 5 MG PO TABS: 5.0000 mg | ORAL_TABLET | Freq: Three times a day (TID) | ORAL | Status: DC | PRN

## 2016-11-23 MED ORDER — ZOLPIDEM TARTRATE 5 MG PO TABS: 5.0000 mg | ORAL_TABLET | Freq: Every evening | ORAL | Status: DC | PRN

## 2016-11-23 MED ORDER — ACETAMINOPHEN 325 MG PO TABS
650.0000 mg | ORAL_TABLET | Freq: Four times a day (QID) | ORAL | Status: DC | PRN
  Administered 2016-11-23: 650 mg via ORAL
  Filled 2016-11-23: qty 2

## 2016-11-23 MED ORDER — HYDROMORPHONE HCL 2 MG/ML IJ SOLN: 0.5000 mg | INTRAMUSCULAR | Status: DC | PRN

## 2016-11-23 MED ORDER — PROPOFOL 500 MG/50ML IV EMUL
INTRAVENOUS | Status: DC | PRN
  Administered 2016-11-23: 50 ug/kg/min via INTRAVENOUS

## 2016-11-23 MED ORDER — FENTANYL CITRATE (PF) 100 MCG/2ML IJ SOLN
INTRAMUSCULAR | Status: AC
  Filled 2016-11-23: qty 2

## 2016-11-23 MED ORDER — CEFAZOLIN SODIUM-DEXTROSE 2-4 GM/100ML-% IV SOLN
INTRAVENOUS | Status: AC
  Filled 2016-11-23: qty 100

## 2016-11-23 MED ORDER — POTASSIUM CHLORIDE IN NACL 20-0.9 MEQ/L-% IV SOLN
INTRAVENOUS | Status: DC
  Administered 2016-11-23: 21:00:00 via INTRAVENOUS
  Filled 2016-11-23: qty 1000

## 2016-11-23 MED ORDER — METOCLOPRAMIDE HCL 5 MG/ML IJ SOLN: 5.0000 mg | Freq: Three times a day (TID) | INTRAMUSCULAR | Status: DC | PRN

## 2016-11-23 MED ORDER — PHENOL 1.4 % MT LIQD: 1.0000 | OROMUCOSAL | Status: DC | PRN

## 2016-11-23 MED ORDER — ONDANSETRON HCL 4 MG/2ML IJ SOLN
INTRAMUSCULAR | Status: DC | PRN
  Administered 2016-11-23: 4 mg via INTRAVENOUS

## 2016-11-23 MED ORDER — ASPIRIN EC 325 MG PO TBEC
325.0000 mg | DELAYED_RELEASE_TABLET | Freq: Every day | ORAL | Status: DC
  Administered 2016-11-24: 325 mg via ORAL
  Filled 2016-11-23: qty 1

## 2016-11-23 MED ORDER — DIPHENHYDRAMINE HCL 12.5 MG/5ML PO ELIX: 12.5000 mg | ORAL_SOLUTION | ORAL | Status: DC | PRN

## 2016-11-23 MED ORDER — 0.9 % SODIUM CHLORIDE (POUR BTL) OPTIME
TOPICAL | Status: DC | PRN
  Administered 2016-11-23: 1000 mL

## 2016-11-23 MED ORDER — OXYCODONE-ACETAMINOPHEN 5-325 MG PO TABS: 1.0000 | ORAL_TABLET | ORAL | 0 refills | Status: AC | PRN

## 2016-11-23 MED ORDER — MENTHOL 3 MG MT LOZG: 1.0000 | LOZENGE | OROMUCOSAL | Status: DC | PRN

## 2016-11-23 MED ORDER — POLYETHYLENE GLYCOL 3350 17 G PO PACK: 17.0000 g | PACK | Freq: Every day | ORAL | Status: DC | PRN

## 2016-11-23 MED ORDER — ONDANSETRON HCL 4 MG PO TABS: 4.0000 mg | ORAL_TABLET | Freq: Four times a day (QID) | ORAL | Status: DC | PRN

## 2016-11-23 SURGICAL SUPPLY — 82 items
BANDAGE ACE 4X5 VEL STRL LF (GAUZE/BANDAGES/DRESSINGS) ×2 IMPLANT
BANDAGE ACE 6X5 VEL STRL LF (GAUZE/BANDAGES/DRESSINGS) ×2 IMPLANT
BANDAGE ESMARK 6X9 LF (GAUZE/BANDAGES/DRESSINGS) ×1 IMPLANT
BENZOIN TINCTURE PRP APPL 2/3 (GAUZE/BANDAGES/DRESSINGS) ×2 IMPLANT
BLADE SAG 18X100X1.27 (BLADE) ×8 IMPLANT
BNDG ESMARK 6X9 LF (GAUZE/BANDAGES/DRESSINGS) ×2
BOOTCOVER CLEANROOM LRG (PROTECTIVE WEAR) ×4 IMPLANT
BOWL SMART MIX CTS (DISPOSABLE) ×2 IMPLANT
CAPT KNEE TOTAL 3 ×2 IMPLANT
CEMENT BONE SIMPLEX SPEEDSET (Cement) ×4 IMPLANT
COVER SURGICAL LIGHT HANDLE (MISCELLANEOUS) ×2 IMPLANT
CUFF TOURNIQUET SINGLE 18IN (TOURNIQUET CUFF) IMPLANT
CUFF TOURNIQUET SINGLE 24IN (TOURNIQUET CUFF) IMPLANT
CUFF TOURNIQUET SINGLE 34IN LL (TOURNIQUET CUFF) ×2 IMPLANT
CUFF TOURNIQUET SINGLE 44IN (TOURNIQUET CUFF) IMPLANT
DECANTER SPIKE VIAL GLASS SM (MISCELLANEOUS) IMPLANT
DRAPE C-ARM 42X72 X-RAY (DRAPES) IMPLANT
DRAPE HALF SHEET 40X57 (DRAPES) ×2 IMPLANT
DRAPE IMP U-DRAPE 54X76 (DRAPES) ×2 IMPLANT
DRAPE OEC MINIVIEW 54X84 (DRAPES) IMPLANT
DRAPE PROXIMA HALF (DRAPES) ×2 IMPLANT
DRAPE U-SHAPE 47X51 STRL (DRAPES) ×2 IMPLANT
DRAPE X RAY CASS MED 25220 (DRAPES) IMPLANT
DRAPE X-RAY CASS 24X20 (DRAPES) IMPLANT
DRSG AQUACEL AG ADV 3.5X10 (GAUZE/BANDAGES/DRESSINGS) ×2 IMPLANT
DRSG PAD ABDOMINAL 8X10 ST (GAUZE/BANDAGES/DRESSINGS) ×2 IMPLANT
DURAPREP 26ML APPLICATOR (WOUND CARE) ×4 IMPLANT
ELECT CAUTERY BLADE 6.4 (BLADE) ×2 IMPLANT
ELECT REM PT RETURN 9FT ADLT (ELECTROSURGICAL) ×2
ELECTRODE REM PT RTRN 9FT ADLT (ELECTROSURGICAL) ×1 IMPLANT
EVACUATOR 1/8 PVC DRAIN (DRAIN) IMPLANT
FACESHIELD WRAPAROUND (MASK) ×4 IMPLANT
GAUZE SPONGE 4X4 12PLY STRL (GAUZE/BANDAGES/DRESSINGS) ×2 IMPLANT
GAUZE XEROFORM 1X8 LF (GAUZE/BANDAGES/DRESSINGS) ×4 IMPLANT
GLOVE BIOGEL PI IND STRL 7.0 (GLOVE) ×1 IMPLANT
GLOVE BIOGEL PI INDICATOR 7.0 (GLOVE) ×1
GLOVE ECLIPSE 7.0 STRL STRAW (GLOVE) ×2 IMPLANT
GLOVE ORTHO TXT STRL SZ7.5 (GLOVE) ×4 IMPLANT
GLOVE SURG ORTHO 7.0 STRL STRW (GLOVE) ×2 IMPLANT
GOWN STRL REUS W/ TWL LRG LVL3 (GOWN DISPOSABLE) ×2 IMPLANT
GOWN STRL REUS W/ TWL XL LVL3 (GOWN DISPOSABLE) ×1 IMPLANT
GOWN STRL REUS W/TWL LRG LVL3 (GOWN DISPOSABLE) ×2
GOWN STRL REUS W/TWL XL LVL3 (GOWN DISPOSABLE) ×1
HANDPIECE INTERPULSE COAX TIP (DISPOSABLE) ×1
IMMOBILIZER KNEE 22 UNIV (SOFTGOODS) ×2 IMPLANT
IMMOBILIZER KNEE 24 THIGH 36 (MISCELLANEOUS) ×1 IMPLANT
IMMOBILIZER KNEE 24 UNIV (MISCELLANEOUS) ×2
KIT BASIN OR (CUSTOM PROCEDURE TRAY) ×2 IMPLANT
KIT ROOM TURNOVER OR (KITS) ×2 IMPLANT
MANIFOLD NEPTUNE II (INSTRUMENTS) ×2 IMPLANT
NEEDLE 18GX1X1/2 (RX/OR ONLY) (NEEDLE) ×2 IMPLANT
NEEDLE HYPO 25GX1X1/2 BEV (NEEDLE) ×2 IMPLANT
NS IRRIG 1000ML POUR BTL (IV SOLUTION) ×2 IMPLANT
PACK ORTHO EXTREMITY (CUSTOM PROCEDURE TRAY) ×2 IMPLANT
PACK TOTAL JOINT (CUSTOM PROCEDURE TRAY) ×2 IMPLANT
PACK UNIVERSAL I (CUSTOM PROCEDURE TRAY) ×2 IMPLANT
PAD ARMBOARD 7.5X6 YLW CONV (MISCELLANEOUS) ×4 IMPLANT
PAD CAST 4YDX4 CTTN HI CHSV (CAST SUPPLIES) ×2 IMPLANT
PADDING CAST COTTON 4X4 STRL (CAST SUPPLIES) ×4
SET HNDPC FAN SPRY TIP SCT (DISPOSABLE) ×1 IMPLANT
SPONGE LAP 4X18 X RAY DECT (DISPOSABLE) ×4 IMPLANT
STAPLER VISISTAT 35W (STAPLE) ×2 IMPLANT
STEM CEMENTED TRIATHLON (Stem) ×2 IMPLANT
STRIP CLOSURE SKIN 1/2X4 (GAUZE/BANDAGES/DRESSINGS) ×4 IMPLANT
SUCTION FRAZIER HANDLE 10FR (MISCELLANEOUS) ×1
SUCTION TUBE FRAZIER 10FR DISP (MISCELLANEOUS) ×1 IMPLANT
SUT MNCRL AB 4-0 PS2 18 (SUTURE) ×2 IMPLANT
SUT VIC AB 0 CT1 27 (SUTURE) ×2
SUT VIC AB 0 CT1 27XBRD ANBCTR (SUTURE) ×2 IMPLANT
SUT VIC AB 1 CTX 36 (SUTURE) ×2
SUT VIC AB 1 CTX36XBRD ANBCTR (SUTURE) ×2 IMPLANT
SUT VIC AB 2-0 CT1 27 (SUTURE) ×3
SUT VIC AB 2-0 CT1 TAPERPNT 27 (SUTURE) ×3 IMPLANT
SYR 50ML LL SCALE MARK (SYRINGE) ×2 IMPLANT
SYR CONTROL 10ML LL (SYRINGE) ×2 IMPLANT
TOWEL OR 17X24 6PK STRL BLUE (TOWEL DISPOSABLE) ×2 IMPLANT
TOWEL OR 17X26 10 PK STRL BLUE (TOWEL DISPOSABLE) ×2 IMPLANT
TRAY CATH 16FR W/PLASTIC CATH (SET/KITS/TRAYS/PACK) IMPLANT
TUBE CONNECTING 12X1/4 (SUCTIONS) ×2 IMPLANT
UNDERPAD 30X30 (UNDERPADS AND DIAPERS) ×2 IMPLANT
WATER STERILE IRR 1000ML POUR (IV SOLUTION) ×2 IMPLANT
YANKAUER SUCT BULB TIP NO VENT (SUCTIONS) IMPLANT

## 2016-11-23 NOTE — Discharge Summary (Signed)
Patient ID: Larry Carr MRN: [id number] DOB/AGE: August 29, 1956 60 y.o.  Admit date: 11/23/2016 Discharge date: 11/23/2016  Admission Diagnoses:  Active Problems:   Primary localized osteoarthritis of right knee   Discharge Diagnoses:  Same  Past Medical History:  Diagnosis Date  . Arthritis   . Complication of anesthesia 2001   after acl repair vomited, Has Panic Attacks  . Dyspnea    "sob when running"  . Headache   . PONV (postoperative nausea and vomiting)    some nausea    Surgeries: Procedure(s): RIGHT TOTAL KNEE ARTHROPLASTY HARDWARE REMOVAL RIGHT PROXIMAL TIBIA on 11/23/2016   Consultants:   Discharged Condition: Improved  Hospital Course: Larry Carr is an 60 y.o. male who was admitted 11/23/2016 for operative treatment of primary localized osteoarthritis right knee. Patient has severe unremitting pain that affects sleep, daily activities, and work/hobbies. After pre-op clearance the patient was taken to the operating room on 11/23/2016 and underwent  Procedure(s): RIGHT TOTAL KNEE ARTHROPLASTY HARDWARE REMOVAL RIGHT PROXIMAL TIBIA.    Patient was given perioperative antibiotics: Anti-infectives    Start     Dose/Rate Route Frequency Ordered Stop   11/23/16 0925  ceFAZolin (ANCEF) IVPB 2g/100 mL premix     2 g 200 mL/hr over 30 Minutes Intravenous On call to O.R. 11/23/16 0925 11/23/16 1120   11/23/16 0924  ceFAZolin (ANCEF) 2-4 GM/100ML-% IVPB    Comments:  Marrianne Moodltman, Deborah   : cabinet override      11/23/16 0924 11/23/16 1105       Patient was given sequential compression devices, early ambulation, and chemoprophylaxis to prevent DVT.  Patient benefited maximally from hospital stay and there were no complications.    Recent vital signs: Patient Vitals for the past 24 hrs:  BP Temp Temp src Pulse Resp SpO2 Height Weight  11/23/16 1420 127/82 - - (!) 48 (!) 8 99 % - -  11/23/16 1405 (!) 125/57 - - 63 18 100 % - -  11/23/16 1345 128/88 - - (!) 51 12  100 % - -  11/23/16 1321 (!) 126/112 97.5 F (36.4 C) - 64 18 100 % - -  11/23/16 [id number] - - - - - - 5\' 8"  (1.727 m) -  11/23/16 [id number] - 98.1 F (36.7 C) Oral 73 18 99 % 5\' 8"  (1.727 m) 95.4 kg (210 lb 6 oz)     Recent laboratory studies: No results for input(s): WBC, HGB, HCT, PLT, NA, K, CL, CO2, BUN, CREATININE, GLUCOSE, INR, CALCIUM in the last 72 hours.  Invalid input(s): PT, 2   Discharge Medications:   Allergies as of 11/23/2016      Reactions   Other    Cantulope- itching Ragweed-severe allergies Raw Potato-itching      Medication List    STOP taking these medications   docusate sodium 100 MG capsule Commonly known as:  COLACE   ibuprofen 200 MG tablet Commonly known as:  ADVIL,MOTRIN   polyethylene glycol packet Commonly known as:  MIRALAX / GLYCOLAX     TAKE these medications   aspirin EC 325 MG tablet Take 1 tablet (325 mg total) by mouth daily. 1 tab a day for the next 30 days to prevent blood clots What changed:  additional instructions   fluticasone 50 MCG/ACT nasal spray Commonly known as:  FLONASE Place 1-2 sprays into both nostrils daily.   LORazepam 1 MG tablet Commonly known as:  ATIVAN Take 0.5-1 mg by mouth every 8 (eight) hours as needed. fior panic  attacks/anxiety   methocarbamol 500 MG tablet Commonly known as:  ROBAXIN Take 1 tablet (500 mg total) by mouth 4 (four) times daily. What changed:  when to take this  reasons to take this   ondansetron 4 MG tablet Commonly known as:  ZOFRAN Take 1 tablet (4 mg total) by mouth every 8 (eight) hours as needed for nausea or vomiting.   oxyCODONE-acetaminophen 5-325 MG tablet Commonly known as:  PERCOCET Take 1-2 tablets by mouth every 4 (four) hours as needed for severe pain. What changed:  how much to take  when to take this  reasons to take this       Diagnostic Studies: Dg Knee Left Port  Result Date: 10/26/2016 CLINICAL DATA:  Post LEFT total knee replacement EXAM: PORTABLE  LEFT KNEE - 1-2 VIEW COMPARISON:  Portable exam 1531 hours without priors for comparison FINDINGS: Components of LEFT knee prosthesis identified. Osseous mineralization normal. No fracture, dislocation, or bone destruction. No periprosthetic lucency. Expected anterior soft tissue changes. IMPRESSION: LEFT knee prosthesis without acute abnormalities. Electronically Signed   By: Ulyses SouthwardMark  Boles M.D.   On: 10/26/2016 15:45    Disposition: 06-Home-Health Care Svc    Follow-up Information    Loreta Aveaniel F Murphy, MD. Schedule an appointment as soon as possible for a visit in 2 week(s).   Specialty:  Orthopedic Surgery Contact information: [address] [place] [address] [phone]            Signed: Otilio SaberM Lindsey Stanbery 11/23/2016, 2:40 PM

## 2016-11-23 NOTE — Anesthesia Procedure Notes (Signed)
Spinal  Patient location during procedure: OR Start time: 11/23/2016 10:55 AM End time: 11/23/2016 11:05 AM Staffing Anesthesiologist: Larry Carr, Larry Carr Performed: anesthesiologist  Preanesthetic Checklist Completed: patient identified, site marked, surgical consent, pre-op evaluation, timeout performed, IV checked, risks and benefits discussed and monitors and equipment checked Spinal Block Patient position: sitting Prep: Betadine Patient monitoring: cardiac monitor, continuous pulse ox and blood pressure Approach: right paramedian Location: L2-3 Needle Needle type: Quincke  Needle gauge: 25 G Needle length: 9 cm Needle insertion depth: 7 cm Assessment Sensory level: T6

## 2016-11-23 NOTE — Interval H&P Note (Signed)
History and Physical Interval Note:  11/23/2016 10:03 AM  Larry Carr  has presented today for surgery, with the diagnosis of djd right knee, hardware complication  The various methods of treatment have been discussed with the patient and family. After consideration of risks, benefits and other options for treatment, the patient has consented to  Procedure(s): TOTAL KNEE ARTHROPLASTY (Right) HARDWARE REMOVAL (Right) as a surgical intervention .  The patient's history has been reviewed, patient examined, no change in status, stable for surgery.  I have reviewed the patient's chart and labs.  Questions were answered to the patient's satisfaction.     Loreta Aveaniel F Layana Konkel

## 2016-11-23 NOTE — Anesthesia Procedure Notes (Signed)
Procedure Name: MAC Date/Time: 11/23/2016 10:43 AM Performed by: Virgel GessHOLTZMAN, Corlette Ciano LEFFEW Pre-anesthesia Checklist: Patient identified, Emergency Drugs available, Suction available, Timeout performed and Patient being monitored Patient Re-evaluated:Patient Re-evaluated prior to inductionOxygen Delivery Method: Simple face mask Placement Confirmation: positive ETCO2

## 2016-11-23 NOTE — Anesthesia Preprocedure Evaluation (Signed)
Anesthesia Evaluation  Patient identified by MRN, date of birth, ID band Patient awake    Reviewed: Allergy & Precautions, NPO status , Patient's Chart, lab work & pertinent test results  History of Anesthesia Complications (+) PONV  Airway Mallampati: II  TM Distance: >3 FB Neck ROM: Full    Dental no notable dental hx.    Pulmonary neg pulmonary ROS,    Pulmonary exam normal breath sounds clear to auscultation       Cardiovascular negative cardio ROS Normal cardiovascular exam Rhythm:Regular Rate:Normal     Neuro/Psych Anxiety negative neurological ROS     GI/Hepatic negative GI ROS, Neg liver ROS,   Endo/Other  negative endocrine ROS  Renal/GU negative Renal ROS  negative genitourinary   Musculoskeletal negative musculoskeletal ROS (+)   Abdominal   Peds negative pediatric ROS (+)  Hematology negative hematology ROS (+)   Anesthesia Other Findings   Reproductive/Obstetrics negative OB ROS                             Anesthesia Physical Anesthesia Plan  ASA: II  Anesthesia Plan: Spinal   Post-op Pain Management:    Induction: Intravenous  Airway Management Planned: Simple Face Mask  Additional Equipment:   Intra-op Plan:   Post-operative Plan:   Informed Consent: I have reviewed the patients History and Physical, chart, labs and discussed the procedure including the risks, benefits and alternatives for the proposed anesthesia with the patient or authorized representative who has indicated his/her understanding and acceptance.   Dental advisory given  Plan Discussed with: CRNA and Surgeon  Anesthesia Plan Comments:         Anesthesia Quick Evaluation

## 2016-11-23 NOTE — Progress Notes (Signed)
Orthopedic Tech Progress Note Patient Details:  Larry Carr 02-Sep-1956 [id number]  CPM Right Knee CPM Right Knee: On Right Knee Flexion (Degrees): 90 Right Knee Extension (Degrees): 0 Additional Comments: rapeze bar patient helper   Nikki DomCrawford, Esequiel Kleinfelter 11/23/2016, 2:38 PM Viewed order from doctor's order list

## 2016-11-23 NOTE — Progress Notes (Signed)
Orthopedic Tech Progress Note Patient Details:  Ulyess MortCurtis Keegan 07/06/1956 [id number] Put on cpm at 1830  Patient ID: Ulyess Morturtis Benoist, male   DOB: 07/06/1956, 60 y.o.   MRN: [id number]   Jennye MoccasinHughes, Lidya Mccalister Craig 11/23/2016, 6:30 PM

## 2016-11-23 NOTE — Anesthesia Postprocedure Evaluation (Signed)
Anesthesia Post Note  Patient: Financial plannerCurtis Dambrosio  Procedure(s) Performed: Procedure(s) (LRB): RIGHT TOTAL KNEE ARTHROPLASTY (Right) HARDWARE REMOVAL RIGHT PROXIMAL TIBIA (Right)  Patient location during evaluation: PACU Anesthesia Type: Spinal Level of consciousness: oriented and awake and alert Pain management: pain level controlled Vital Signs Assessment: post-procedure vital signs reviewed and stable Respiratory status: spontaneous breathing, respiratory function stable and patient connected to nasal cannula oxygen Cardiovascular status: blood pressure returned to baseline and stable Postop Assessment: no headache, no backache and spinal receding Anesthetic complications: no       Last Vitals:  Vitals:   11/23/16 1505 11/23/16 1524  BP: (!) 139/97 129/89  Pulse: 63 (!) 57  Resp: 12 17  Temp:  36.7 C    Last Pain:  Vitals:   11/23/16 1524  TempSrc:   PainSc: 0-No pain    LLE Motor Response: Purposeful movement (11/23/16 1524) LLE Sensation: Decreased;Tingling (11/23/16 1524) RLE Motor Response: Purposeful movement (11/23/16 1524) RLE Sensation: Decreased;Tingling (11/23/16 1524) L Sensory Level: S1-Sole of foot, small toes (11/23/16 1524) R Sensory Level: S1-Sole of foot, small toes (11/23/16 1524)  Takisha Pelle,JAMES TERRILL

## 2016-11-23 NOTE — Transfer of Care (Signed)
Immediate Anesthesia Transfer of Care Note  Patient: Larry Carr  Procedure(s) Performed: Procedure(s): RIGHT TOTAL KNEE ARTHROPLASTY (Right) HARDWARE REMOVAL RIGHT PROXIMAL TIBIA (Right)  Patient Location: PACU  Anesthesia Type:MAC  Level of Consciousness: awake, alert , patient cooperative and responds to stimulation  Airway & Oxygen Therapy: Patient Spontanous Breathing and Patient connected to face mask oxygen  Post-op Assessment: Report given to RN and Post -op Vital signs reviewed and stable  Post vital signs: Reviewed and stable  Last Vitals:  Vitals:   11/23/16 0922 11/23/16 1321  Pulse: 73 64  Resp: 18 18  Temp: 36.7 C 36.4 C    Last Pain:  Vitals:   11/23/16 1321  TempSrc:   PainSc: (P) 0-No pain         Complications: No apparent anesthesia complications

## 2016-11-24 ENCOUNTER — Encounter (HOSPITAL_COMMUNITY): Payer: Self-pay | Admitting: Orthopedic Surgery

## 2016-11-24 LAB — BASIC METABOLIC PANEL
Anion gap: 8 (ref 5–15)
BUN: 16 mg/dL (ref 6–20)
CALCIUM: 8.9 mg/dL (ref 8.9–10.3)
CHLORIDE: 102 mmol/L (ref 101–111)
CO2: 28 mmol/L (ref 22–32)
CREATININE: 0.97 mg/dL (ref 0.61–1.24)
GFR calc non Af Amer: >60 mL/min (ref >60)
GLUCOSE: 121 mg/dL — AB (ref 65–99)
Potassium: 4 mmol/L (ref 3.5–5.1)
Sodium: 138 mmol/L (ref 135–145)

## 2016-11-24 LAB — CBC
HEMATOCRIT: 38.5 % — AB (ref 39.0–52.0)
HEMOGLOBIN: 13.1 g/dL (ref 13.0–17.0)
MCH: 30 pg (ref 26.0–34.0)
MCHC: 34 g/dL (ref 30.0–36.0)
MCV: 88.1 fL (ref 78.0–100.0)
Platelets: 312 10*3/uL (ref 150–400)
RBC: 4.37 MIL/uL (ref 4.22–5.81)
RDW: 12.7 % (ref 11.5–15.5)
WBC: 14.2 10*3/uL — ABNORMAL HIGH (ref 4.0–10.5)

## 2016-11-24 NOTE — Evaluation (Addendum)
Occupational Therapy Evaluation/Discharge Patient Details Name: Larry Carr MRN: [id number] DOB: [birth year] Today's Date: 11/24/2016    History of Present Illness Patient is a 60 yo male who presents with R TKA on 11/23/16. 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 Pt currently requires supervision for all ADL. He will have 24 hour assistance available from wife and other family members post-acute D/C. They have a good understanding of recovery process as he underwent L TKA in November. Wife demonstrated ability to provide necessary supervision/assistance during OT evaluation. Educated pt on safe shower transfers, ADL post-operatively, and dressing techniques. Pt demonstrates understanding of all topics. Pt does not want to sit for showering and reports that he will use grab bar to assist with balance. Pt and wife report no further questions or concerns. No further acute OT needs identified and no OT follow-up recommended. OT will sign off.    Follow Up Recommendations  No OT follow up;Supervision/Assistance - 24 hour    Equipment Recommendations  None recommended by OT (Has all needs met)       Precautions / Restrictions Precautions Precautions: Knee;Fall Precaution Booklet Issued: No Precaution Comments: Reviewed knee precautions during ADL Restrictions Weight Bearing Restrictions: Yes RLE Weight Bearing: Weight bearing as tolerated      Mobility Bed Mobility Overal bed mobility: Modified Independent             General bed mobility comments: HOB elevated, use of bed rails  Transfers Overall transfer level: Modified independent Equipment used: Rolling walker (2 wheeled)             General transfer comment: No physical assist required; pt with good hand placement and safety awareness with sit to stand     Balance Overall balance assessment: No apparent balance deficits (not formally assessed)                                          ADL Overall ADL's : Needs assistance/impaired     Grooming: Set up;Sitting   Upper Body Bathing: Set up;Sitting   Lower Body Bathing: Sit to/from stand;Supervison/ safety   Upper Body Dressing : Set up;Sitting   Lower Body Dressing: Supervision/safety;Sit to/from stand   Toilet Transfer: Supervision/safety;Ambulation;RW;BSC   Toileting- Water quality scientist and Hygiene: Supervision/safety;Sit to/from stand   Tub/ Shower Transfer: Tub transfer;Supervision/safety;Ambulation;Rolling walker Tub/Shower Transfer Details (indicate cue type and reason): Pt refusing use of 3-in-1 or shower seat for sitting in shower because pt reporting able to stand and hold on to grab bars. Pt able to complete shower transfer without 3-in-1/shower seat with supervision for safety. Functional mobility during ADLs: Supervision/safety       Vision Vision Assessment?: No apparent visual deficits   Perception     Praxis      Pertinent Vitals/Pain Pain Assessment: Faces Pain Score: 4  Faces Pain Scale: Hurts a little bit Pain Location: R quad Pain Descriptors / Indicators: Aching Pain Intervention(s): Monitored during session;Repositioned     Hand Dominance Right   Extremity/Trunk Assessment Upper Extremity Assessment Upper Extremity Assessment: Overall WFL for tasks assessed   Lower Extremity Assessment Lower Extremity Assessment: RLE deficits/detail RLE Deficits / Details: Decreased strength and ROM as expected post-operatively.       Communication Communication Communication:  No difficulties   Cognition Arousal/Alertness: Awake/alert Behavior During Therapy: WFL for tasks assessed/performed Overall Cognitive Status: Within Functional Limits for tasks assessed                     General Comments       Exercises Exercises:  Total Joint     Shoulder Instructions      Home Living Family/patient expects to be discharged to:: Private residence Living Arrangements: Spouse/significant other;Children Available Help at Discharge: Family;Available 24 hours/day Type of Home: House Home Access: Stairs to enter [employer] of Steps: 5 Entrance Stairs-Rails: Right;Left;Can reach both Home Layout: One level     Bathroom Shower/Tub: Tub/shower unit Shower/tub characteristics: Curtain Biochemist, clinical: Handicapped height     Home Equipment: Environmental consultant - 2 wheels;Bedside commode;Tub bench;Cane - single point;Grab bars - toilet;Grab bars - tub/shower          Prior Functioning/Environment Level of Independence: Independent        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        OT Problem List: Decreased strength;Decreased range of motion;Decreased safety awareness;Decreased knowledge of use of DME or AE;Pain   OT Treatment/Interventions:      OT Goals(Current goals can be found in the care plan section) Acute Rehab OT Goals Patient Stated Goal: get up and go home OT Goal Formulation: With patient/family Time For Goal Achievement: 12/01/16 Potential to Achieve Goals: Good  OT Frequency:     Barriers to D/C:            Co-evaluation              End of Session Equipment Utilized During Treatment: Gait belt;Rolling walker  Activity Tolerance: Patient tolerated treatment well Patient left: in bed;with call bell/phone within reach;with family/visitor present   Time: 1050-1110 OT Time Calculation (min): 20 min Charges:  OT General Charges $OT Visit: 1 Procedure OT Evaluation $OT Eval Moderate Complexity: 1 Procedure  Norman Herrlich, OTR/L [phone] 11/24/2016, 11:20 AM

## 2016-11-24 NOTE — Care Management Note (Signed)
Case Management Note  Patient Details  Name: Larry Carr MRN: [id number] Date of Birth: 03/06/1956  Subjective/Objective:  60 yr old gentleman s/p right total knee arthroplasty.                  Action/Plan: Case manager spoke with patient and his wife concerning Home Health and DME needs. Patient was pre[facility] with [facility], no changes. Patient states he already has rolling walker and 3in1, he will not be using CPM. Patient has family support at discharge.    Expected Discharge Date:    11/24/16              Expected Discharge Plan:  Home w Home Health Services  In-House Referral:  NA  Discharge planning Services     Post Acute Care Choice:  Home Health Choice offered to:  Patient  DME Arranged:    DME Agency:  NA  HH Arranged:  PT HH Agency:  [facility]  Status of Service:  Completed, signed off  If discussed at Long Length of Stay Meetings, dates discussed:    Additional Comments:  Durenda GuthrieBrady, Deyton Ellenbecker Naomi, RN 11/24/2016, 1:11 PM

## 2016-11-24 NOTE — Progress Notes (Signed)
Subjective: 1 Day Post-Op Procedure(s) (LRB): RIGHT TOTAL KNEE ARTHROPLASTY (Right) HARDWARE REMOVAL RIGHT PROXIMAL TIBIA (Right) Patient reports pain as mild.    Objective: Vital signs in last 24 hours: Temp:  [97.5 F (36.4 C)-98.2 F (36.8 C)] 97.9 F (36.6 C) (12/21 0417) Pulse Rate:  [48-77] 56 (12/21 0417) Resp:  [8-18] 16 (12/21 0417) BP: (114-145)/(57-112) 122/69 (12/21 0417) SpO2:  [96 %-100 %] 96 % (12/21 0417) Weight:  [95.4 kg (210 lb 6 oz)] 95.4 kg (210 lb 6 oz) (12/20 0922)  Intake/Output from previous day: 12/20 0701 - 12/21 0700 In: 2425 [P.O.:480; I.V.:1845; IV Piggyback:100] Out: 850 [Urine:800; Blood:50] Intake/Output this shift: Total I/O In: -  Out: 500 [Urine:500]  No results for input(s): HGB in the last 72 hours. No results for input(s): WBC, RBC, HCT, PLT in the last 72 hours. No results for input(s): NA, K, CL, CO2, BUN, CREATININE, GLUCOSE, CALCIUM in the last 72 hours. No results for input(s): LABPT, INR in the last 72 hours.  Neurologically intact Neurovascular intact Sensation intact distally Intact pulses distally Dorsiflexion/Plantar flexion intact Incision: dressing C/D/I No cellulitis present Compartment soft  Assessment/Plan: 1 Day Post-Op Procedure(s) (LRB): RIGHT TOTAL KNEE ARTHROPLASTY (Right) HARDWARE REMOVAL RIGHT PROXIMAL TIBIA (Right) Advance diet Up with therapy D/C IV fluids Discharge home with home health after second session of PT as long as patient does well and as long as he is not super nauseous WBAT RLE Please remove ace bandage and apply ted hose to BLE prior to d/c  Otilio SaberM Lindsey Stanbery 11/24/2016, 7:19 AM

## 2016-11-24 NOTE — Op Note (Deleted)
  The note originally documented on this encounter has been moved the the encounter in which it belongs.  

## 2016-11-24 NOTE — Op Note (Signed)
NAME:  Larry PicketNCHER, Larry Carr              ACCOUNT NO.:  [id number]  MEDICAL RECORD NO.:  [id number]  LOCATION:                                 FACILITY:  PHYSICIAN:  Loreta Aveaniel F. Krystianna Soth, M.D. DATE OF BIRTH:  1956/01/26  DATE OF PROCEDURE:  11/23/2016 DATE OF DISCHARGE:  11/24/2016                              OPERATIVE REPORT   PREOPERATIVE DIAGNOSES:  Right knee end-stage arthritis, primary generalized.  Retained hardware from ACL reconstruction with an EndoButton on the femur and a metallic screw and washer in the tibia.  POSTOPERATIVE DIAGNOSES:  Right knee end-stage arthritis, primary generalized.  Retained hardware from ACL reconstruction with EndoButton on the femur and a metallic screw and washer in the tibia.  PROCEDURE:  Right knee modified minimally invasive total knee replacement Stryker triathlon prosthesis.  A cemented pegged posterior stabilized #5 femoral component.  A #6 universal baseplate on the tibia with a 50 mm stem and a 9 mm PS insert.  Resurfacing 35 mm patellar component.  Removal of the EndoButton from the femur.  Removal of screw and washer from tibia and packing of defect with autologous cancellous bone from the bone cuttings.  SURGEON:  Loreta Aveaniel F. Alika Saladin, M.D.  ASSISTANT:  Mikey KirschnerLindsey Stanberry, PA, present throughout the entire case, necessary for timely completion of procedure.  ANESTHESIA:  Spinal.  BLOOD LOSS:  Minimal.  SPECIMENS:  None.  CULTURES:  None.  COMPLICATION:  None.  DRESSINGS:  Soft compressive knee immobilizer.  TOURNIQUET TIME:  1 hour 50 minutes.  DESCRIPTION OF PROCEDURE:  The patient was brought to operating room and after adequate anesthesia had been obtained, tourniquet applied. Prepped and draped in usual sterile fashion.  Exsanguinated with elevation of Esmarch.  Tourniquet inflated to 350 mmHg.  Straight incision above the patella down to tibial tubercle.  Medial arthrotomy, vastus splitting, preserving quad tendon.  Through  that opening adequate access, the EndoButton on the femur were removed that along with trailing sutures.  Intramedullary flexible rod of the femur.  8 mm resection 5 degrees of valgus.  Using epicondylar axis, the femur was sized, cut, and fitted for a posterior stabilized pegged #5 component. Extramedullary guide on the tibia.  0 degree cut.  Sized to #6 component.  I tried to ream this for the keel, but it abutted the screw, so I had to remove the screw.  This was exposed on the front of the tibia.  Completely overgrown with bone.  I had to use osteotomes to expose this.  Then finally able to remove the screw and large metallic washer.  This left a considerable cortical defect communicating to the tunnel up into the tibia.  This was packed with the autologous cancellous in cortical bone to fill the defect, but still allowing room for the component.  I then reamed the tibia after setting rotation with trials.  I __________ the universal base plate with a stem, so I could get below the defect from where the screw was.  Patella exposed. Posterior 10 mm removed.  Drilled, sized, and fitted for a 35-mm component.  All trials removed.  Copious irrigation with pulse irrigating device.  Maintaining the graft on the tibia.  All components were firmly cemented in place.  Polyethylene attached to tibia and knee. Patella held with a clamp.  Once the cement hardened, the knee was irrigated again.  Soft tissue injected with Exparel.  Arthrotomy closed with #1 Vicryl.  Subcutaneous and subcuticular closure.  Margins were injected with Marcaine.  Sterile compressive dressing applied. Tourniquet was deflated and removed.  Knee immobilizer applied. Anesthesia reversed.  Brought to the recovery room.  Tolerated the surgery well.  No complications.     Loreta Aveaniel F. Cederick Broadnax, M.D.   ______________________________ Loreta Aveaniel F. Kacie Huxtable, M.D.    DFM/MEDQ  D:  11/24/2016  T:  11/24/2016  Job:  [id number]

## 2016-11-24 NOTE — Progress Notes (Signed)
Physical Therapy Evaluation Patient Details Name: Larry Carr Hinze MRN: [id number] DOB: May 09, 1956 Today's Date: 11/24/2016   History of Present Illness  Patient is a 60 yo male who presents with R TKA on 11/23/16. 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 PMH:  arthritis, dyspnea with exercise  Clinical Impression  PTA, pt was independent with all ADLs and community ambulation. Pt lives with his wife who is available 24/7 and son who is available PRN. Pt with good mobility today. Pt was able to safely navigate steps without physical assist from PT. Pt also able to ambulate 225 ft today and participate in HEP, indicating good progress towards baseline activity tolerance. Pt is a good candidate for HHPT to continue progression towards PLOF. PT will continue to follow acutely to address deficits in strength and ROM before d/c to next venue.     Follow Up Recommendations Home health PT;Supervision - Intermittent    Equipment Recommendations  None recommended by PT    Recommendations for Other Services       Precautions / Restrictions Precautions Precautions: Knee;Fall Precaution Booklet Issued: No Precaution Comments: Reviewed knee precautions during ADL Restrictions Weight Bearing Restrictions: Yes RLE Weight Bearing: Weight bearing as tolerated      Mobility  Bed Mobility Overal bed mobility: Modified Independent             General bed mobility comments: HOB elevated, use of bed rails  Transfers Overall transfer level: Modified independent Equipment used: Rolling walker (2 wheeled)             General transfer comment: No physical assist required; pt with good hand placement and safety awareness with sit to stand  Ambulation/Gait Ambulation/Gait assistance: Modified independent (Device/Increase time) Ambulation Distance (Feet): 225 Feet Assistive device: Rolling walker (2 wheeled) Gait Pattern/deviations: Step-through pattern;Antalgic;Decreased weight shift to  right Gait velocity: decreased Gait velocity interpretation: Below normal speed for age/gender General Gait Details: Pt with antalgic gait likely 2/2 decreased ROM and strength as expected post-operatively.   Stairs Stairs: Yes Stairs assistance: Min guard Stair Management: One rail Left;Step to pattern;Forwards Number of Stairs: 5 General stair comments: Min guard for safety. Pt required no physical assist for stair navigation  Wheelchair Mobility    Modified Rankin (Stroke Patients Only)       Balance Overall balance assessment: No apparent balance deficits (not formally assessed)                                           Pertinent Vitals/Pain Pain Assessment: Faces Pain Score: 4  Faces Pain Scale: Hurts a little bit Pain Location: R quad Pain Descriptors / Indicators: Aching Pain Intervention(s): Monitored during session;Repositioned    Home Living Family/patient expects to be discharged to:: Private residence Living Arrangements: Spouse/significant other;Children Available Help at Discharge: Family;Available 24 hours/day Type of Home: House Home Access: Stairs to enter Entrance Stairs-Rails: Right;Left;Can reach both Entrance Stairs-Number of Steps: 5 Home Layout: One level Home Equipment: Walker - 2 wheels;Bedside commode;Tub bench;Cane - single point;Grab bars - toilet;Grab bars - tub/shower      Prior Function Level of Independence: Independent         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       Communication   Communication: No difficulties  Cognition Arousal/Alertness: Awake/alert Behavior During Therapy: WFL for  tasks assessed/performed Overall Cognitive Status: Within Functional Limits for tasks assessed                      General Comments      Exercises Total Joint Exercises Ankle Circles/Pumps: AROM;Seated;Both;10 reps Quad Sets: AROM;Right;10 reps;Seated Heel Slides: AROM;Right;10 reps;Seated Hip ABduction/ADduction: AROM;Right;10 reps;Seated Goniometric ROM: 0 to 65   Assessment/Plan    PT Assessment Patient needs continued PT services  PT Problem List Decreased strength;Decreased range of motion;Decreased activity tolerance;Decreased knowledge of use of DME;Decreased knowledge of precautions;Decreased safety awareness;Pain;Decreased balance;Decreased mobility          PT Treatment Interventions DME instruction;Gait training;Stair training;Functional mobility training;Therapeutic activities;Therapeutic exercise;Balance training;Patient/family education    PT Goals (Current goals can be found in the Care Plan section)  Acute Rehab PT Goals Patient Stated Goal: get up and go home PT Goal Formulation: With patient Time For Goal Achievement: 12/08/16 Potential to Achieve Goals: Good    Frequency 7X/week   Barriers to discharge        Co-evaluation               End of Session Equipment Utilized During Treatment: Gait belt Activity Tolerance: Patient tolerated treatment well Patient left: in chair;with call bell/phone within reach;with family/visitor present           Time: [id number] PT Time Calculation (min) (ACUTE ONLY): 36 min   Charges:   PT Evaluation $PT Eval Moderate Complexity: 1 Procedure PT Treatments $Gait Training: 8-22 mins   PT G Codes:        Gaye PollackRebecca Kim 11/24/2016, 11:28 AM Gaye Pollackebecca Kim, SPT [phone]  This note has been read, reviewed and agreed with the treatment and plan. Marland Kitchen.  Christiane HaBenjamin J. Jamal Pavon, PT, CSCS Pager [phone] Office [phone]

## 2016-11-24 NOTE — Discharge Instructions (Signed)
Cuidado de la incisin (Incision Care) La incisin es el corte que el cirujano realiza en el cuerpo. Despus de la [place], es necesario brindarle el cuidado necesario para evitar que se infecte. CMO CUIDAR DEL CORTE  Tome los medicamentos solamente como se lo haya indicado el mdico.  Hay muchas maneras distintas de cerrar y cubrir un corte, como puntos, pegamento para la piel y tiras [place]. Siga las indicaciones del mdico para:  Cuidar del corte.  Cambiar y Advertising account plannerquitar el vendaje.  Quitar el cierre del corte.  No tome baos de inmersin, no practique natacin ni use el jacuzzi hasta que el mdico lo autorice. Puede ducharse como se lo haya indicado el mdico.  Reanude su dieta y sus actividades habituales como se lo haya indicado el mdico.  Use un medicamento que reduzca la picazn en el corte como se lo haya indicado el mdico. No se toque ni se rasque el corte.  Beba suficiente lquido para mantener el pis (orina) claro o de color amarillo plido. SOLICITE AYUDA SI:  Tiene enrojecimiento, hinchazn o dolor en el lugar del corte.  Observa lquido, sangre o pus que sale del corte.  Le duelen los msculos.  Tiene nuseas o escalofros.  Advierte un olor ftido que proviene de la herida o del vendaje.  El corte se abre despus de que le [employer] Third Bancorphayan quitado los puntos, las grapas o las tiras [place].  Sigue teniendo Programme researcher, broadcasting/film/videomalestar estomacal (nuseas) o vmitos que no se interrumpen.  Tiene fiebre.  Tiene mareos. SOLICITE AYUDA DE INMEDIATO SI:  Tiene una erupcin cutnea.  Pierde el conocimiento (se desmaya).  Tiene dificultad para respirar. ASEGRESE DE QUE:  Comprende estas instrucciones.  Controlar su afeccin.  Recibir ayuda de inmediato si no mejora o si empeora. Esta informacin no tiene Theme park managercomo fin reemplazar el consejo del mdico. Asegrese de hacerle al mdico cualquier pregunta que tenga. Document Released: 05/22/2012 Document Revised: 12/12/2014 Document  Reviewed: 06/08/2016 Elsevier Interactive Patient Education  2017 [employer] INSTRUCTIONS AFTER JOINT REPLACEMENT   o Remove items at home which could result in a fall. This includes throw rugs or furniture in walking pathways o ICE to the affected joint every three hours while awake for 30 minutes at a time, for at least the first 3-5 days, and then as needed for pain and swelling.  Continue to use ice for pain and swelling. You may notice swelling that will progress down to the foot and ankle.  This is normal after surgery.  Elevate your leg when you are not up walking on it.   o Continue to use the breathing machine you got in the hospital (incentive spirometer) which will help keep your temperature down.  It is common for your temperature to cycle up and down following surgery, especially at night when you are not up moving around and exerting yourself.  The breathing machine keeps your lungs expanded and your temperature down.   DIET:  As you were doing prior to hospitalization, we recommend a well-balanced diet.  DRESSING / WOUND CARE / SHOWERING  Keep the surgical dressing until follow up.  The dressing is water proof, so you can shower without any extra covering.  IF THE DRESSING FALLS OFF or the wound gets wet inside, change the dressing with sterile gauze.  Please use good hand washing techniques before changing the dressing.  Do not use any lotions or creams on the incision until instructed by your surgeon.    ACTIVITY  o Increase activity slowly as  tolerated, but follow the weight bearing instructions below.   o No driving for 6 weeks or until further direction given by your physician.  You cannot drive while taking narcotics.  o No lifting or carrying greater than 10 lbs. until further directed by your surgeon. o Avoid periods of inactivity such as sitting longer than an hour when not asleep. This helps prevent blood clots.  o You may return to work once you are authorized by  your doctor.     WEIGHT BEARING   Weight bearing as tolerated with assist device (walker, cane, etc) as directed, use it as long as suggested by your surgeon or therapist, typically at least 4-6 weeks.   EXERCISES  Results after joint replacement surgery are often greatly improved when you follow the exercise, range of motion and muscle strengthening exercises prescribed by your doctor. Safety measures are also important to protect the joint from further injury. Any time any of these exercises cause you to have increased pain or swelling, decrease what you are doing until you are comfortable again and then slowly increase them. If you have problems or questions, call your caregiver or physical therapist for advice.   Rehabilitation is important following a joint replacement. After just a few days of immobilization, the muscles of the leg can become weakened and shrink (atrophy).  These exercises are designed to build up the tone and strength of the thigh and leg muscles and to improve motion. Often times heat used for twenty to thirty minutes before working out will loosen up your tissues and help with improving the range of motion but do not use heat for the first two weeks following surgery (sometimes heat can increase post-operative swelling).   These exercises can be done on a training (exercise) mat, on the floor, on a table or on a bed. Use whatever works the best and is most comfortable for you.    Use music or television while you are exercising so that the exercises are a pleasant break in your day. This will make your life better with the exercises acting as a break in your routine that you can look forward to.   Perform all exercises about fifteen times, three times per day or as directed.  You should exercise both the operative leg and the other leg as well.  Exercises include:    Quad Sets - Tighten up the muscle on the front of the thigh (Quad) and hold for 5-10 seconds.     Straight Leg Raises - With your knee straight (if you were given a brace, keep it on), lift the leg to 60 degrees, hold for 3 seconds, and slowly lower the leg.  Perform this exercise against resistance later as your leg gets stronger.   Leg Slides: Lying on your back, slowly slide your foot toward your buttocks, bending your knee up off the floor (only go as far as is comfortable). Then slowly slide your foot back down until your leg is flat on the floor again.   Angel Wings: Lying on your back spread your legs to the side as far apart as you can without causing discomfort.   Hamstring Strength:  Lying on your back, push your heel against the floor with your leg straight by tightening up the muscles of your buttocks.  Repeat, but this time bend your knee to a comfortable angle, and push your heel against the floor.  You may put a pillow under the heel to make it more  comfortable if necessary.   A rehabilitation program following joint replacement surgery can speed recovery and prevent re-injury in the future due to weakened muscles. Contact your doctor or a physical therapist for more information on knee rehabilitation.    CONSTIPATION  Constipation is defined medically as fewer than three stools per week and severe constipation as less than one stool per week.  Even if you have a regular bowel pattern at home, your normal regimen is likely to be disrupted due to multiple reasons following surgery.  Combination of anesthesia, postoperative narcotics, change in appetite and fluid intake all can affect your bowels.   YOU MUST use at least one of the following options; they are listed in order of increasing strength to get the job done.  They are all available over the counter, and you may need to use some, POSSIBLY even all of these options:    Drink plenty of fluids (prune juice may be helpful) and high fiber foods Colace 100 mg by mouth twice a day  Senokot for constipation as directed and as  needed Dulcolax (bisacodyl), take with full glass of water  Miralax (polyethylene glycol) once or twice a day as needed.  If you have tried all these things and are unable to have a bowel movement in the first 3-4 days after surgery call either your surgeon or your primary doctor.    If you experience loose stools or diarrhea, hold the medications until you stool forms back up.  If your symptoms do not get better within 1 week or if they get worse, check with your doctor.  If you experience "the worst abdominal pain ever" or develop nausea or vomiting, please contact the office immediately for further recommendations for treatment.   ITCHING:  If you experience itching with your medications, try taking only a single pain pill, or even half a pain pill at a time.  You can also use Benadryl over the counter for itching or also to help with sleep.   TED HOSE STOCKINGS:  Use stockings on both legs until for at least 2 weeks or as directed by physician office. They may be removed at night for sleeping.  MEDICATIONS:  See your medication summary on the After Visit Summary that nursing will review with you.  You may have some home medications which will be placed on hold until you complete the course of blood thinner medication.  It is important for you to complete the blood thinner medication as prescribed.  PRECAUTIONS:  If you experience chest pain or shortness of breath - call 911 immediately for transfer to the hospital emergency department.   If you develop a fever greater that 101 F, purulent drainage from wound, increased redness or drainage from wound, foul odor from the wound/dressing, or calf pain - CONTACT YOUR SURGEON.                                                   FOLLOW-UP APPOINTMENTS:  If you do not already have a post-op appointment, please call the office for an appointment to be seen by your surgeon.  Guidelines for how soon to be seen are listed in your After Visit Summary, but  are typically between 1-4 weeks after surgery.  OTHER INSTRUCTIONS:   Knee Replacement:  Do not place pillow under knee, focus on keeping  the knee straight while resting. CPM instructions: 0-90 degrees, 2 hours in the morning, 2 hours in the afternoon, and 2 hours in the evening. Place foam block, curve side up under heel at all times except when in CPM or when walking.  DO NOT modify, tear, cut, or change the foam block in any way.  MAKE SURE YOU:   Understand these instructions.   Get help right away if you are not doing well or get worse.    Thank you for letting us be a part of your medical care team.  It is a privilege we respect greatly.  We hope these instructions will help you stay on track for a fast and full recovery!

## 2017-03-05 IMAGING — CR DG KNEE 1-2V PORT*R*
2 series · 2 of 2 positions shown · non-contrast
Comparison: None in PACs

CLINICAL DATA: Status post right total knee joint replacement.

EXAM:
PORTABLE RIGHT KNEE - 1-2 VIEW

[AP]
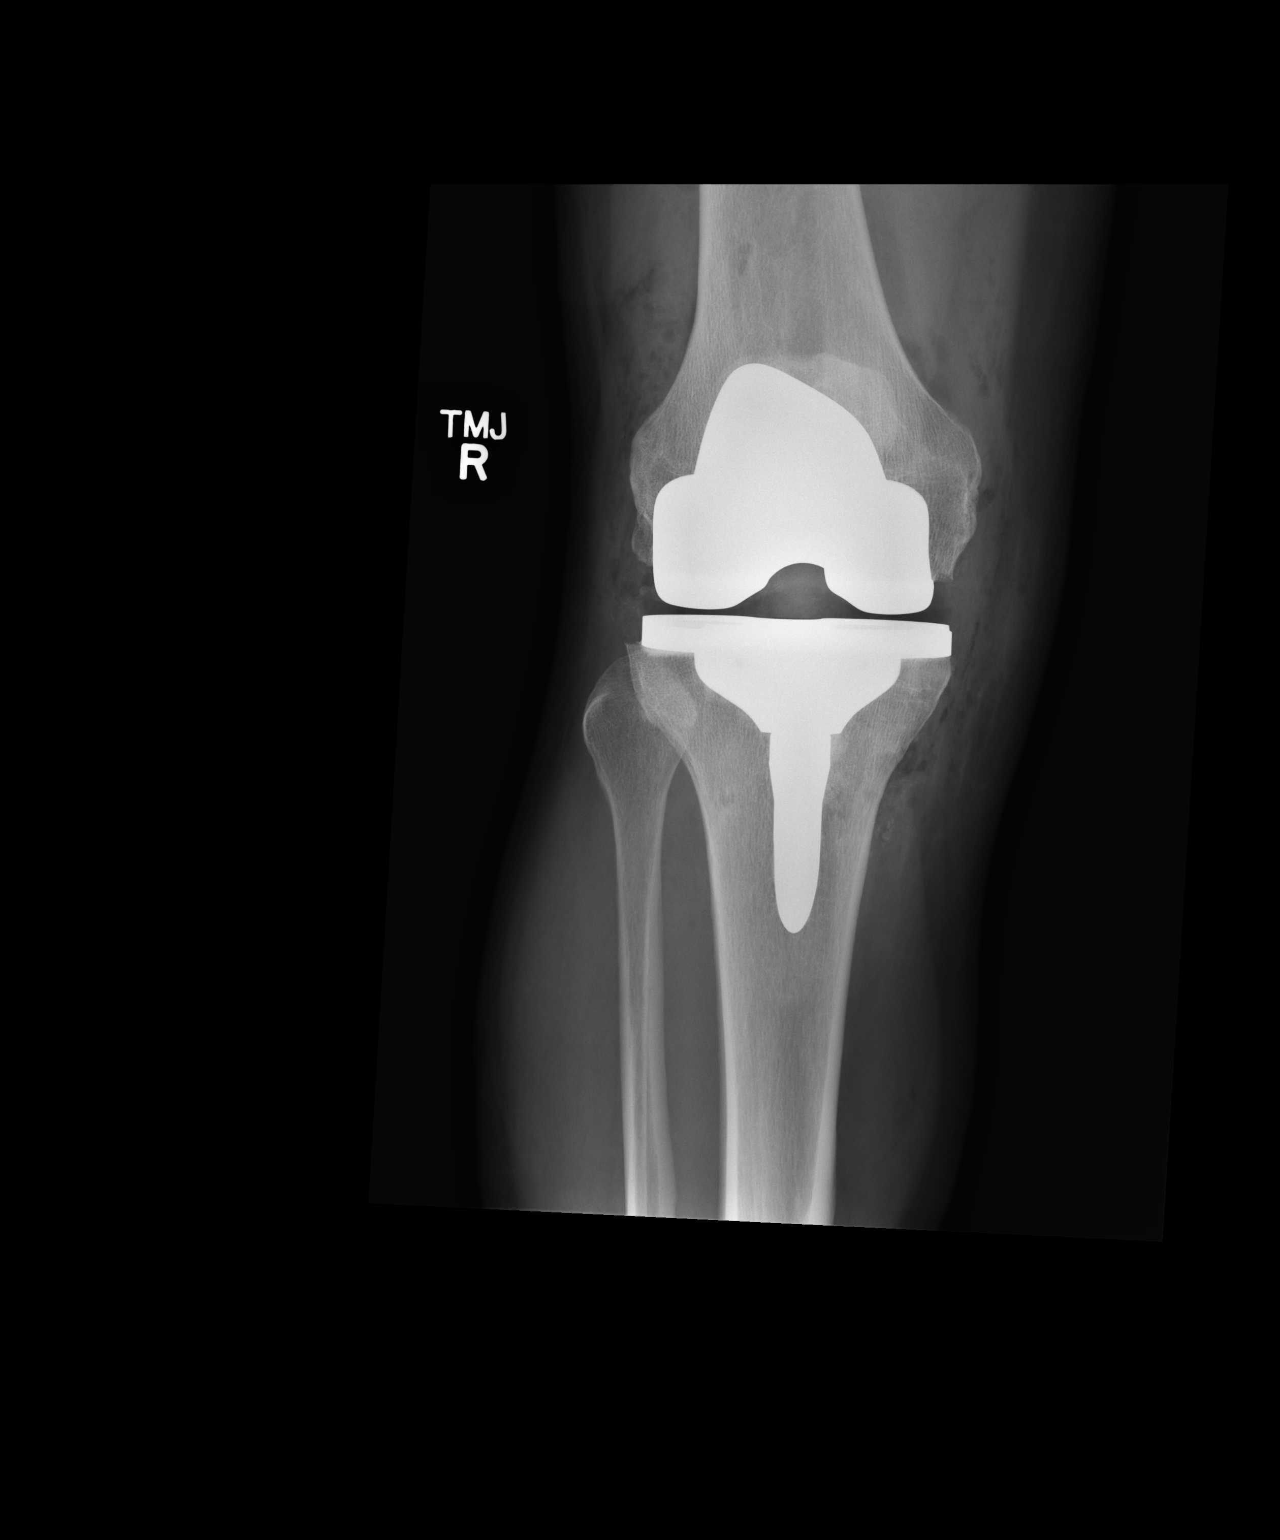

[xtable lateral]
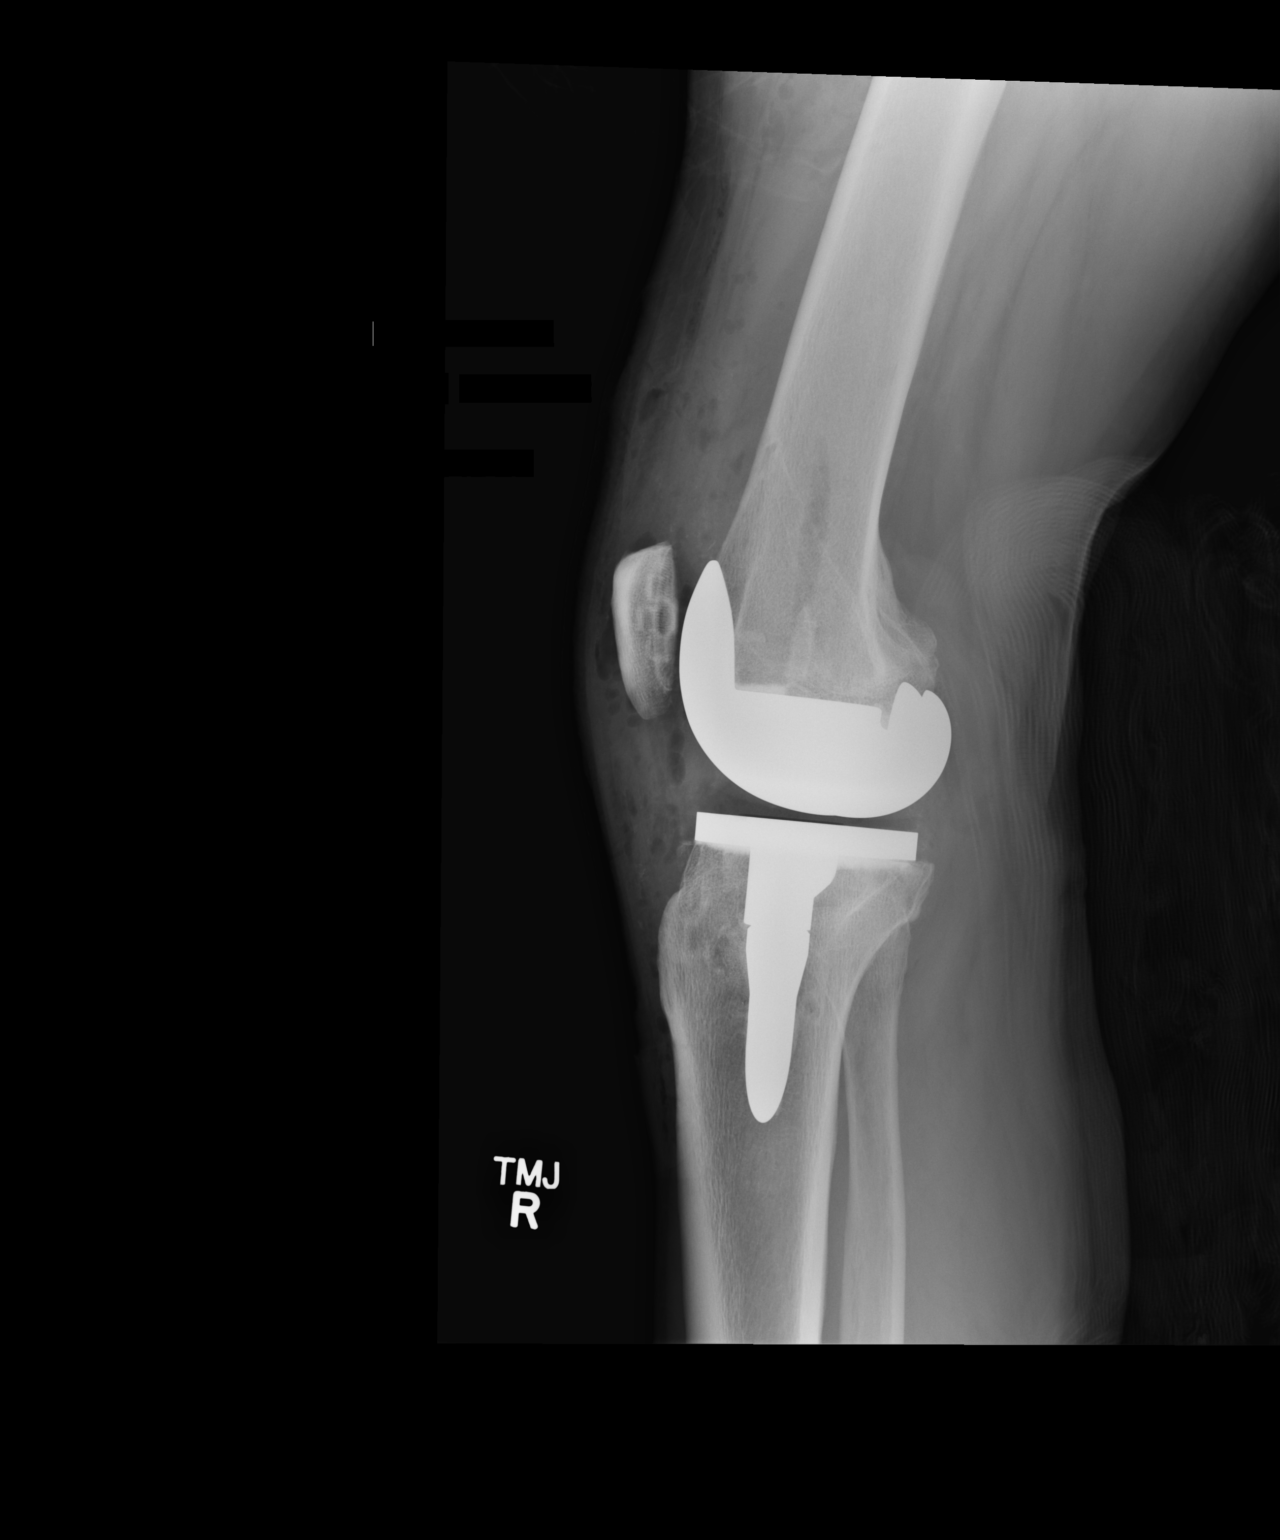

[2 of 2 positions shown; findings below may reference images not displayed]

FINDINGS: The prosthesis appears to be appropriately positioned. The interface
with the native bone appears normal. No acute native bone
abnormality is observed. There is a small amount of air in fluid in
the anterior soft tissues.
IMPRESSION: No immediate postprocedure complication following right total knee
joint replacement.

## 2023-05-17 DIAGNOSIS — I351 Nonrheumatic aortic (valve) insufficiency: Secondary | ICD-10-CM | POA: Diagnosis not present

## 2023-05-17 DIAGNOSIS — I361 Nonrheumatic tricuspid (valve) insufficiency: Secondary | ICD-10-CM | POA: Diagnosis not present

## 2023-05-25 ENCOUNTER — Encounter: Payer: Self-pay | Admitting: Family Medicine
